# Patient Record
Sex: Male | Born: 1975 | Race: Black or African American | Hispanic: No | Marital: Single | Smoking: Current every day smoker
Health system: Southern US, Community
[De-identification: ages and names within clinical notes are randomized; demographics above are authoritative.]

## PROBLEM LIST (undated history)

## (undated) DIAGNOSIS — F419 Anxiety disorder, unspecified: Secondary | ICD-10-CM

## (undated) DIAGNOSIS — I10 Essential (primary) hypertension: Secondary | ICD-10-CM

## (undated) DIAGNOSIS — J45909 Unspecified asthma, uncomplicated: Secondary | ICD-10-CM

## (undated) HISTORY — DX: Anxiety disorder, unspecified: F41.9

## (undated) HISTORY — DX: Unspecified asthma, uncomplicated: J45.909

## (undated) HISTORY — PX: APPENDECTOMY: SHX54

---

## 2014-07-14 ENCOUNTER — Emergency Department: Payer: Self-pay | Admitting: Emergency Medicine

## 2016-02-26 ENCOUNTER — Encounter: Payer: Self-pay | Admitting: Emergency Medicine

## 2016-02-26 ENCOUNTER — Emergency Department
Admission: EM | Admit: 2016-02-26 | Discharge: 2016-02-26 | Disposition: A | Discharge status: Home / Self Care | Payer: Self-pay | Attending: Emergency Medicine | Admitting: Emergency Medicine

## 2016-02-26 DIAGNOSIS — Y939 Activity, unspecified: Secondary | ICD-10-CM | POA: Insufficient documentation

## 2016-02-26 DIAGNOSIS — Y929 Unspecified place or not applicable: Secondary | ICD-10-CM | POA: Insufficient documentation

## 2016-02-26 DIAGNOSIS — K122 Cellulitis and abscess of mouth: Secondary | ICD-10-CM | POA: Insufficient documentation

## 2016-02-26 DIAGNOSIS — I10 Essential (primary) hypertension: Secondary | ICD-10-CM | POA: Insufficient documentation

## 2016-02-26 DIAGNOSIS — S025XXB Fracture of tooth (traumatic), initial encounter for open fracture: Secondary | ICD-10-CM | POA: Insufficient documentation

## 2016-02-26 DIAGNOSIS — F17210 Nicotine dependence, cigarettes, uncomplicated: Secondary | ICD-10-CM | POA: Insufficient documentation

## 2016-02-26 DIAGNOSIS — X58XXXA Exposure to other specified factors, initial encounter: Secondary | ICD-10-CM | POA: Insufficient documentation

## 2016-02-26 DIAGNOSIS — Y999 Unspecified external cause status: Secondary | ICD-10-CM | POA: Insufficient documentation

## 2016-02-26 HISTORY — DX: Essential (primary) hypertension: I10

## 2016-02-26 MED ORDER — AMOXICILLIN 500 MG PO TABS: 500.0000 mg | ORAL_TABLET | Freq: Three times a day (TID) | ORAL | Status: DC

## 2016-02-26 MED ORDER — IBUPROFEN 600 MG PO TABS: 600.0000 mg | ORAL_TABLET | Freq: Four times a day (QID) | ORAL | Status: DC | PRN

## 2016-02-26 MED ORDER — LIDOCAINE VISCOUS 2 % MT SOLN: 10.0000 mL | OROMUCOSAL | Status: DC | PRN

## 2016-02-26 NOTE — ED Provider Notes (Signed)
[facility] Emergency Department Provider Note  ____________________________________________  Time seen: Approximately 12:02 PM  I have reviewed the triage vital signs and the nursing notes.   HISTORY  Chief Complaint Dental Pain    HPI Drezden Seitzinger. is a 40 y.o. male , NAD, presents to the emergency department with 2 week history of tooth pain and broken teeth. States he's had chronic issues with dentition for some time. Has had pain on and off in his mouth with swelling for 6 months. Notes that 2 teeth have broken and crumbled which is increased his pain over the last couple of days. Did have some swelling about his gumline which has improved with warm salt water gargles but is still present. Has applied for [facility] insurance but has not received coverage at this time. Has consult with local dentists but unfortunately cannot pay those places to get his teeth fixed. He has not looked into any free or low cost clinics and our area. Denies any headache, neck pain, earache. Has not seen any oozing or weeping. No injury or trauma to the face or mouth.   Past Medical History  Diagnosis Date  . Hypertension     There are no active problems to display for this patient.   Past Surgical History  Procedure Laterality Date  . Appendectomy      Current Outpatient Rx  Name  Route  Sig  Dispense  Refill  . amoxicillin (AMOXIL) 500 MG tablet   Oral   Take 1 tablet (500 mg total) by mouth 3 (three) times daily with meals.   30 tablet   0   . ibuprofen (ADVIL,MOTRIN) 600 MG tablet   Oral   Take 1 tablet (600 mg total) by mouth every 6 (six) hours as needed.   30 tablet   0   . lidocaine (XYLOCAINE) 2 % solution   Mouth/Throat   Use as directed 10 mLs in the mouth or throat every 4 (four) hours as needed for mouth pain.   100 mL   0     Allergies Review of patient's allergies indicates no known allergies.  No family history on file.  Social  History Social History  Substance Use Topics  . Smoking status: Current Every Day Smoker -- 1.00 packs/day    Types: Cigarettes  . Smokeless tobacco: None  . Alcohol Use: No     Review of Systems  Constitutional: No fever/chills ENT: Positive dental pain and broken teeth. Cardiovascular: No chest pain. Respiratory: No shortness of breath. No wheezing.  Musculoskeletal: Negative for neck pain.  Skin: Negative for rash, redness, swelling. Neurological: Negative for headaches, focal weakness or numbness. No tingling 10-point ROS otherwise negative.  ____________________________________________   PHYSICAL EXAM:  VITAL SIGNS: ED Triage Vitals  Enc Vitals Group     BP 02/26/16 1146 153/92 mmHg     Pulse Rate 02/26/16 1146 59     Resp 02/26/16 1146 18     Temp 02/26/16 1146 97.8 F (36.6 C)     Temp Source 02/26/16 1146 Oral     SpO2 02/26/16 1146 100 %     Weight 02/26/16 1140 160 lb (72.576 kg)     Height 02/26/16 1140  (1.753 m)     Head Cir --      Peak Flow --      Pain Score 02/26/16 1143 10     Pain Loc --      Pain Edu? --  Excl. in GC? --      Constitutional: Alert and oriented. Well appearing and in no acute distress. Eyes: Conjunctivae are normal.  Head: Atraumatic. ENT:      Nose: No congestion/rhinnorhea.      Mouth/Throat: Mucous membranes are moist. Poor dentition throughout. Broken teeth noted about the left lower jawline and right upper jawline. No active oozing or weeping about the gumline. Mild injection and swelling about the left lower gum line with no tenderness to palpation. Neck: Supple with full range of motion. Hematological/Lymphatic/Immunilogical: No cervical lymphadenopathy. Cardiovascular:  Good peripheral circulation. Respiratory: Normal respiratory effort without tachypnea or retractions.  Neurologic:  Normal speech and language. No gross focal neurologic deficits are appreciated.  Skin:  Skin is warm, dry and intact. No rash,  redness, swelling noted. Psychiatric: Mood and affect are normal. Speech and behavior are normal. Patient exhibits appropriate insight and judgement.   ____________________________________________   LABS  None ____________________________________________  EKG  None ____________________________________________  RADIOLOGY  None ____________________________________________    PROCEDURES  Procedure(s) performed: None    Medications - No data to display   ____________________________________________   INITIAL IMPRESSION / ASSESSMENT AND PLAN / ED COURSE  Patient's diagnosis is consistent with cellulitis oral soft tissues and broken teeth. Patient will be discharged home with prescriptions for amoxicillin, ibuprofen and lidocaine viscus to use as directed. Patient is to follow up with the walk in dental clinic at the [facility] today for evaluation. Patient is given ED precautions to return to the ED for any worsening or new symptoms.    ____________________________________________  FINAL CLINICAL IMPRESSION(S) / ED DIAGNOSES  Final diagnoses:  Cellulitis of oral soft tissues  Broken tooth, open, initial encounter      NEW MEDICATIONS STARTED DURING THIS VISIT:  New Prescriptions   AMOXICILLIN (AMOXIL) 500 MG TABLET    Take 1 tablet (500 mg total) by mouth 3 (three) times daily with meals.   IBUPROFEN (ADVIL,MOTRIN) 600 MG TABLET    Take 1 tablet (600 mg total) by mouth every 6 (six) hours as needed.   LIDOCAINE (XYLOCAINE) 2 % SOLUTION    Use as directed 10 mLs in the mouth or throat every 4 (four) hours as needed for mouth pain.         Hope PigeonJami L Mckinsey Keagle, PA-C 02/26/16 1216  Jennye MoccasinBrian S Quigley, MD 02/26/16 1227

## 2016-02-26 NOTE — ED Notes (Signed)
Dental pain for 2 weeks now, off and on.

## 2016-02-26 NOTE — ED Notes (Signed)
Pt verbalized understanding of discharge instructions. NAD at this time. 

## 2016-02-26 NOTE — ED Notes (Signed)
Pt states 2 broken teeth. One on bottom left and one on bottom right.

## 2016-02-26 NOTE — Discharge Instructions (Signed)
Dental Abscess    A dental abscess is pus in or around a tooth.  HOME CARE  Take medicines only as told by your dentist.  If you were prescribed antibiotic medicine, finish all of it even if you start to feel better.  Rinse your mouth (gargle) often with salt water.  Do not drive or use heavy machinery, like a lawn mower, while taking pain medicine.  Do not apply heat to the outside of your mouth.  Keep all follow-up visits as told by your dentist. This is important. GET HELP IF:  Your pain is worse, and medicine does not help. GET HELP RIGHT AWAY IF:  You have a fever or chills.  Your symptoms suddenly get worse.  You have a very bad headache.  You have problems breathing or swallowing.  You have trouble opening your mouth.  You have puffiness (swelling) in your neck or around your eye. This information is not intended to replace advice given to you by your health care provider. Make sure you discuss any questions you have with your health care provider.  Document Released: 01/13/2015 Document Reviewed: 01/13/2015  Elsevier Interactive Patient Education 2016 [employer] Inc[employer]    Dental Pain    Dental pain may be caused by many things, including:  Tooth decay (cavities or caries). Cavities cause the nerve of your tooth to be open to air and hot or cold temperatures. This can cause pain or discomfort.  Abscess or infection. A dental abscess is an area that is full of infected pus from a bacterial infection in the inner part of the tooth (pulp). It usually happens at the end of the tooth's root.  Injury.  An unknown reason (idiopathic). Your pain may be mild or severe. It may only happen when:  You are chewing.  You are exposed to hot or cold temperature.  You are eating or drinking sugary foods or beverages, such as:  Soda.  Candy. Your pain may also be there all of the time.  HOME CARE  Watch your dental pain for any changes. Do these things to lessen your discomfort:  Take  medicines only as told by your dentist.  If your dentist tells you to take an antibiotic medicine, finish all of it even if you start to feel better.  Keep all follow-up visits as told by your dentist. This is important.  Do not apply heat to the outside of your face.  Rinse your mouth or gargle with salt water if told by your dentist. This helps with pain and swelling.  You can make salt water by adding  tsp of salt to 1 cup of warm water. Apply ice to the painful area of your face:  Put ice in a plastic bag.  Place a towel between your skin and the bag.  Leave the ice on for 20 minutes, 2-3 times per day. Avoid foods or drinks that cause you pain, such as:  Very hot or very cold foods or drinks.  Sweet or sugary foods or drinks. GET HELP IF:  Your pain is not helped with medicines.  Your symptoms are worse.  You have new symptoms. GET HELP RIGHT AWAY IF:  You cannot open your mouth.  You are having trouble breathing or swallowing.  You have a fever.  Your face, neck, or jaw is puffy (swollen). This information is not intended to replace advice given to you by your health care provider. Make sure you discuss any questions you have with your  health care provider.  °Document Released: 02/15/2008 Document Revised: 01/13/2015 Document Reviewed: 08/25/2014  °Elsevier Interactive Patient Education ©2016 Elsevier Inc.  ° °

## 2016-03-01 ENCOUNTER — Encounter: Payer: Self-pay | Admitting: Emergency Medicine

## 2016-03-01 ENCOUNTER — Emergency Department
Admission: EM | Admit: 2016-03-01 | Discharge: 2016-03-01 | Disposition: A | Discharge status: Home / Self Care | Payer: Self-pay | Attending: Emergency Medicine | Admitting: Emergency Medicine

## 2016-03-01 DIAGNOSIS — I10 Essential (primary) hypertension: Secondary | ICD-10-CM | POA: Insufficient documentation

## 2016-03-01 DIAGNOSIS — F17210 Nicotine dependence, cigarettes, uncomplicated: Secondary | ICD-10-CM | POA: Insufficient documentation

## 2016-03-01 DIAGNOSIS — N341 Nonspecific urethritis: Secondary | ICD-10-CM | POA: Insufficient documentation

## 2016-03-01 LAB — URINALYSIS COMPLETE WITH MICROSCOPIC (ARMC ONLY)
Bilirubin Urine: NEGATIVE
Glucose, UA: NEGATIVE mg/dL
HGB URINE DIPSTICK: NEGATIVE
Ketones, ur: NEGATIVE mg/dL
Nitrite: NEGATIVE
PROTEIN: NEGATIVE mg/dL
SPECIFIC GRAVITY, URINE: 1.026 (ref 1.005–1.030)
Squamous Epithelial / LPF: NONE SEEN
pH: 5 (ref 5.0–8.0)

## 2016-03-01 LAB — CHLAMYDIA/NGC RT PCR (ARMC ONLY)
CHLAMYDIA TR: NOT DETECTED
N gonorrhoeae: NOT DETECTED

## 2016-03-01 MED ORDER — CEFTRIAXONE SODIUM 250 MG IJ SOLR
250.0000 mg | INTRAMUSCULAR | Status: DC
  Administered 2016-03-01: 250 mg via INTRAMUSCULAR
  Filled 2016-03-01: qty 250

## 2016-03-01 MED ORDER — AZITHROMYCIN 500 MG PO TABS
1000.0000 mg | ORAL_TABLET | Freq: Once | ORAL | Status: AC
  Administered 2016-03-01: 1000 mg via ORAL
  Filled 2016-03-01: qty 2

## 2016-03-01 NOTE — ED Notes (Signed)
Was seen about 4 days ago with dental abscess.. States he feels like the infection is "spreading" thur out his body  Body aches and "personal " issues

## 2016-03-01 NOTE — ED Provider Notes (Signed)
[facility] Emergency Department Provider Note   ____________________________________________  Time seen: Approximately 7:25 AM  I have reviewed the triage vital signs and the nursing notes.   HISTORY  Chief Complaint Dental Pain   HPI Timothy PeerRobert Leroy Dorer Jr. is a [age] y.o. male is here with complaint of penile discharge for the last 3 days. Patient states that he was here in the emergency room on 6/16 for dental pain and was given amoxicillin which she has been taking. He states that for the last 3 days he has had a white discharge. When questioned more direct patient states that he did have unprotected sex approximately 3 weeks ago and that he only "had oral sex".He gives the impression that the patient did not even know this person very well at all.   Past Medical History  Diagnosis Date  . Hypertension     There are no active problems to display for this patient.   Past Surgical History  Procedure Laterality Date  . Appendectomy      Current Outpatient Rx  Name  Route  Sig  Dispense  Refill  . amoxicillin (AMOXIL) 500 MG tablet   Oral   Take 1 tablet (500 mg total) by mouth 3 (three) times daily with meals.   30 tablet   0   . ibuprofen (ADVIL,MOTRIN) 600 MG tablet   Oral   Take 1 tablet (600 mg total) by mouth every 6 (six) hours as needed.   30 tablet   0   . lidocaine (XYLOCAINE) 2 % solution   Mouth/Throat   Use as directed 10 mLs in the mouth or throat every 4 (four) hours as needed for mouth pain.   100 mL   0     Allergies Review of patient's allergies indicates no known allergies.  No family history on file.  Social History Social History  Substance Use Topics  . Smoking status: Current Every Day Smoker -- 1.00 packs/day    Types: Cigarettes  . Smokeless tobacco: None  . Alcohol Use: No    Review of Systems Constitutional: No fever/chills ENT: No sore throat. Cardiovascular: Denies chest pain. Respiratory:  Denies shortness of breath. Gastrointestinal:  No nausea, no vomiting.   Genitourinary: Positive for dysuria. Musculoskeletal: Negative for back pain. Skin: Negative for rash. Neurological: Negative for headaches, focal weakness or numbness.  10-point ROS otherwise negative.  ____________________________________________   PHYSICAL EXAM:  VITAL SIGNS: ED Triage Vitals  Enc Vitals Group     BP --      Pulse --      Resp --      Temp --      Temp src --      SpO2 --      Weight --      Height --      Head Cir --      Peak Flow --      Pain Score --      Pain Loc --      Pain Edu? --      Excl. in GC? --     Constitutional: Alert and oriented. Well appearing and in no acute distress. Eyes: Conjunctivae are normal. PERRL. EOMI. Head: Atraumatic. Nose: No congestion/rhinnorhea. Neck: No stridor.   Hematological/Lymphatic/Immunilogical: No cervical lymphadenopathy. Cardiovascular: Normal rate, regular rhythm. Grossly normal heart sounds.  Good peripheral circulation. Respiratory: Normal respiratory effort.  No retractions. Lungs CTAB. Gastrointestinal: Soft and nontender. No distention.  Musculoskeletal: No lower extremity  tenderness nor edema.  No joint effusions. Neurologic:  Normal speech and language. No gross focal neurologic deficits are appreciated. No gait instability. Skin:  Skin is warm, dry and intact. No rash noted. Psychiatric: Mood and affect are normal. Speech and behavior are normal.  ____________________________________________   LABS (all labs ordered are listed, but only abnormal results are displayed)  Labs Reviewed  URINALYSIS COMPLETEWITH MICROSCOPIC ([facility] ONLY) - Abnormal; Notable for the following:    Color, Urine YELLOW (*)    APPearance CLEAR (*)    Leukocytes, UA TRACE (*)    Bacteria, UA RARE (*)    All other components within normal limits  CHLAMYDIA/NGC RT PCR ([facility] ONLY)    PROCEDURES  Procedure(s) performed:  None  Critical Care performed: No  ____________________________________________   INITIAL IMPRESSION / ASSESSMENT AND PLAN / ED COURSE  Pertinent labs & imaging results that were available during my care of the patient were reviewed by me and considered in my medical decision making (see chart for details).  Patient was given Rocephin 250 mg IM in the emergency room along with Zithromax 1000 mg by mouth. Patient is to follow-up with the [facility] Department if any continued problems. He is aware that his cultures have not resulted but that his urine didn't show infection. Patient is also to inform his sexual partner and refrain from unprotected sex. ____________________________________________   FINAL CLINICAL IMPRESSION(S) / ED DIAGNOSES  Final diagnoses:  Urethritis, nonspecific      NEW MEDICATIONS STARTED DURING THIS VISIT:  Discharge Medication List as of 03/01/2016  9:46 AM       Note:  This document was prepared using Dragon voice recognition software and may include unintentional dictation errors.    Tommi Rumps, PA-C 03/01/16 1141

## 2016-03-01 NOTE — Discharge Instructions (Signed)
Urethritis, Adult Urethritis is an inflammation of the tube through which urine exits your bladder (urethra).  CAUSES Urethritis is often caused by an infection in your urethra. The infection can be viral, like herpes. The infection can also be bacterial, like gonorrhea. RISK FACTORS Risk factors of urethritis include:  Having sex without using a condom.  Having multiple sexual partners.  Having poor hygiene. SIGNS AND SYMPTOMS Symptoms of urethritis are less noticeable in women than in men. These symptoms include:  Burning feeling when you urinate (dysuria).  Discharge from your urethra.  Blood in your urine (hematuria).  Urinating more than usual. DIAGNOSIS  To confirm a diagnosis of urethritis, your health care provider will do the following:  Ask about your sexual history.  Perform a physical exam.  Have you provide a sample of your urine for lab testing.  Use a cotton swab to gently collect a sample from your urethra for lab testing. TREATMENT  It is important to treat urethritis. Depending on the cause, untreated urethritis may lead to serious genital infections and possibly infertility. Urethritis caused by a bacterial infection is treated with antibiotic medicine. All sexual partners must be treated.  HOME CARE INSTRUCTIONS  Do not have sex until the test results are known and treatment is completed, even if your symptoms go away before you finish treatment.  If you were prescribed an antibiotic, finish it all even if you start to feel better. SEEK MEDICAL CARE IF:   Your symptoms are not improved in 3 days.  Your symptoms are getting worse.  You develop abdominal pain or pelvic pain (in women).  You develop joint pain.  You have a fever. SEEK IMMEDIATE MEDICAL CARE IF:   You have severe pain in the belly, back, or side.  You have repeated vomiting. MAKE SURE YOU:  Understand these instructions.  Will watch your condition.  Will get help right away  if you are not doing well or get worse.   This information is not intended to replace advice given to you by your health care provider. Make sure you discuss any questions you have with your health care provider.   Document Released: 02/22/2001 Document Revised: 01/13/2015 Document Reviewed: 04/29/2013 Elsevier Interactive Patient Education 2016 [employer]    You [facility] if any continued problems. Given information to your partner that they may be checked out as well. Do not have sex until her lower partner has been treated. Do not have unprotected sex. You have been given medication today which should treat your symptoms.

## 2016-05-14 ENCOUNTER — Encounter: Payer: Self-pay | Admitting: Emergency Medicine

## 2016-05-14 ENCOUNTER — Emergency Department
Admission: EM | Admit: 2016-05-14 | Discharge: 2016-05-14 | Disposition: A | Discharge status: Home / Self Care | Payer: Self-pay | Attending: Emergency Medicine | Admitting: Emergency Medicine

## 2016-05-14 DIAGNOSIS — I10 Essential (primary) hypertension: Secondary | ICD-10-CM | POA: Insufficient documentation

## 2016-05-14 DIAGNOSIS — Z79899 Other long term (current) drug therapy: Secondary | ICD-10-CM | POA: Insufficient documentation

## 2016-05-14 DIAGNOSIS — F319 Bipolar disorder, unspecified: Secondary | ICD-10-CM | POA: Insufficient documentation

## 2016-05-14 DIAGNOSIS — F17210 Nicotine dependence, cigarettes, uncomplicated: Secondary | ICD-10-CM | POA: Insufficient documentation

## 2016-05-14 DIAGNOSIS — F419 Anxiety disorder, unspecified: Secondary | ICD-10-CM | POA: Insufficient documentation

## 2016-05-14 LAB — URINE DRUG SCREEN, QUALITATIVE (ARMC ONLY)
Amphetamines, Ur Screen: NOT DETECTED
BARBITURATES, UR SCREEN: NOT DETECTED
BENZODIAZEPINE, UR SCRN: NOT DETECTED
COCAINE METABOLITE, UR ~~LOC~~: NOT DETECTED
Cannabinoid 50 Ng, Ur ~~LOC~~: NOT DETECTED
MDMA (Ecstasy)Ur Screen: NOT DETECTED
METHADONE SCREEN, URINE: NOT DETECTED
OPIATE, UR SCREEN: NOT DETECTED
PHENCYCLIDINE (PCP) UR S: NOT DETECTED
Tricyclic, Ur Screen: NOT DETECTED

## 2016-05-14 LAB — CBC
HCT: 45.7 % (ref 40.0–52.0)
Hemoglobin: 16 g/dL (ref 13.0–18.0)
MCH: 32.2 pg (ref 26.0–34.0)
MCHC: 35 g/dL (ref 32.0–36.0)
MCV: 92 fL (ref 80.0–100.0)
PLATELETS: 177 10*3/uL (ref 150–440)
RBC: 4.97 MIL/uL (ref 4.40–5.90)
RDW: 12.1 % (ref 11.5–14.5)
WBC: 8.4 10*3/uL (ref 3.8–10.6)

## 2016-05-14 LAB — BASIC METABOLIC PANEL
Anion gap: 6 (ref 5–15)
BUN: 13 mg/dL (ref 6–20)
CALCIUM: 9.7 mg/dL (ref 8.9–10.3)
CHLORIDE: 105 mmol/L (ref 101–111)
CO2: 27 mmol/L (ref 22–32)
CREATININE: 1.1 mg/dL (ref 0.61–1.24)
GFR calc non Af Amer: >60 mL/min (ref >60)
Glucose, Bld: 96 mg/dL (ref 65–99)
Potassium: 3.6 mmol/L (ref 3.5–5.1)
SODIUM: 138 mmol/L (ref 135–145)

## 2016-05-14 LAB — ETHANOL: Alcohol, Ethyl (B): <5 mg/dL (ref <5)

## 2016-05-14 LAB — SALICYLATE LEVEL: Salicylate Lvl: <4 mg/dL (ref 2.8–30.0)

## 2016-05-14 LAB — ACETAMINOPHEN LEVEL

## 2016-05-14 LAB — TROPONIN I

## 2016-05-14 MED ORDER — DIAZEPAM 2 MG PO TABS
2.0000 mg | ORAL_TABLET | Freq: Once | ORAL | Status: AC
  Administered 2016-05-14: 2 mg via ORAL
  Filled 2016-05-14: qty 1

## 2016-05-14 MED ORDER — CLONAZEPAM 1 MG PO TABS: 1.0000 mg | ORAL_TABLET | Freq: Two times a day (BID) | ORAL | 0 refills | Status: DC

## 2016-05-14 MED ORDER — CITALOPRAM HYDROBROMIDE 20 MG PO TABS: ORAL_TABLET | ORAL | 2 refills | Status: DC

## 2016-05-14 NOTE — ED Triage Notes (Addendum)
Patient presents to the ED with anxiety and hypertension.  Patient states, "I've been feeling this sometimes for like a month now.  Today I felt normal, and then I took a nap and when I woke up I felt the feeling again, like something wasn't right.  I feel like I could do push ups but my left pinky feels kind of numb."  Patient's smile looks even, grip strength is equal and dorsi and pedal flexion is intact.  Patient is pacing the room during triage.  Patient states, "I think my anxiety is playing a part in this."  Denies dizziness, denies blurry vision, denies headache.  Patient denies chest pain.

## 2016-05-14 NOTE — BH Assessment (Signed)
Tele Assessment Note   Timothy PeerRobert Leroy Sudol Jr. is an 40 y.o. male, African American, Single who presents to [facility] for anxiety/ depression c/o pain. Patient states primary concern is of anxiety elevated and physiological responses to anxiety. Patient states that he currently lives with mother although this is off and on at times. Patient states has hx. Of leisure with music and art, and in the past this helped with anxiety and depression. States took meds in past for it, but meds. Made him feel not self and "too mellow." Patient thoughts racing and at times hard to follow, and was pacing during assessment. Patient did state that he is against taking his own life and that has never been a thought or problem related to the matter. Patent brought self to hospital. Patient acknowledges hx. Of little to no sleep with days where he can go w/o sleep, but norms are for x 2 hours sleep at night then 3-4 during day with naps. Patient acknowledges hx. Of manic episodes and vouts of depression. Patient also acknowledges hx. Of anxiety with worries and racing thoughts.   Patient denies current or past hx. Of SI/HI. Patient denies hx. Of AVH or S.A. Patient denies hx. Of inpatient psychiatric care. Patient acknowledges past outpatient psych care for depression and anxiety at or around x 7-8 years ago. Patient denies current medications,and has no current primary care doctor.   Patient is dressed in normal street attire, and is alert and oriented x4. Patient speech was rapid with though interruptions.  Motor behavior appeared restless with pacing, and hyperactivity.  Patient thought process is coherent yet thought blocking with racing thoughts.. Patient does not appear to be responding to internal stimuli. Patient was cooperative throughout the assessment.   Diagnosis: Bipolar I Disorder, Current or most Recent Episode Hypomanic, Unspecifed  Past Medical History:  Past Medical History:  Diagnosis Date  . Hypertension      Past Surgical History:  Procedure Laterality Date  . APPENDECTOMY      Family History: No family history on file.  Social History:  reports that he has been smoking Cigarettes.  He has been smoking about 1.00 pack per day. He has never used smokeless tobacco. He reports that he does not drink alcohol. His drug history is not on file.  Additional Social History:  Alcohol / Drug Use Pain Medications: SEE MAR Prescriptions: SEE MAR Over the Counter: SEE MAR History of alcohol / drug use?: No history of alcohol / drug abuse Longest period of sobriety (when/how long): n/a  CIWA: CIWA-Ar BP: (!) 150/96 Pulse Rate: 63 COWS:    PATIENT STRENGTHS: (choose at least two) Active sense of humor Average or above average intelligence Capable of independent living Communication skills  Allergies: No Known Allergies  Home Medications:  (Not in a hospital admission)  OB/GYN Status:  No LMP for male patient.  General Assessment Data [facility] Assessment: In system Is this a Tele or Face-to-Face Assessment?: Face-to-Face Is this an Initial Assessment or a Re-assessment for this encounter?: Initial Assessment Marital status: Single Maiden name:  (n/a) Is patient pregnant?: No Pregnancy Status: No Living Arrangements: Parent Can pt return to current living arrangement?: Yes Admission Status: Voluntary Is patient capable of signing voluntary admission?: Yes Referral Source: Self/Family/Friend Insurance type:  (SP)     Crisis Care Plan Living Arrangements: Parent Name of Psychiatrist: none Name of Therapist: none  Education Status Is patient currently in school?: No Current Grade: n/a Highest  grade of school patient has completed: unspecified Name of school: n/a Contact person: mother  Risk to self with the past 6 months Suicidal Ideation: No Has patient been a risk to self within the past 6 months prior to admission? : No Suicidal Intent:  No Has patient had any suicidal intent within the past 6 months prior to admission? : No Is patient at risk for suicide?: Yes (Bipolar I) Suicidal Plan?: No Has patient had any suicidal plan within the past 6 months prior to admission? : No Access to Means: No What has been your use of drugs/alcohol within the last 12 months?: past , some alcohol Previous Attempts/Gestures: No How many times?: 0 Other Self Harm Risks: none noted Triggers for Past Attempts: Unpredictable Intentional Self Injurious Behavior: None Family Suicide History: Yes Recent stressful life event(s): Trauma (Comment), Turmoil (Comment) Persecutory voices/beliefs?: No Depression: No Substance abuse history and/or treatment for substance abuse?: No Suicide prevention information given to non-admitted patients: Not applicable  Risk to Others within the past 6 months Homicidal Ideation: No Does patient have any lifetime risk of violence toward others beyond the six months prior to admission? : No Thoughts of Harm to Others: No Current Homicidal Intent: No Current Homicidal Plan: No Access to Homicidal Means: No Identified Victim: none History of harm to others?: No Assessment of Violence: None Noted Violent Behavior Description: n/a Does patient have access to weapons?: No Criminal Charges Pending?: No Does patient have a court date: No Is patient on probation?: No  Psychosis Hallucinations: None noted Delusions: None noted  Mental Status Report Appearance/Hygiene: Unremarkable Eye Contact: Good Motor Activity: Freedom of movement, Gait exaggerated, Gestures, Hyperactivity Speech: Rapid Level of Consciousness: Alert Mood: Anxious Affect: Anxious Anxiety Level: Panic Attacks Panic attack frequency: unpredictable Most recent panic attack: 05/14/16 Thought Processes: Coherent, Relevant, Flight of Ideas, Thought Blocking Judgement: Partial Orientation: Person, Place, Time, Situation, Appropriate for  developmental age Obsessive Compulsive Thoughts/Behaviors: None  Cognitive Functioning Concentration: Normal Memory: Recent Intact, Remote Intact IQ: Average Insight: Fair Impulse Control: Poor Appetite: Good Weight Loss: 0 Weight Gain: 0 Sleep: Decreased Total Hours of Sleep: 2 Vegetative Symptoms: None  ADLScreening [facility] Assessment Services) Patient's cognitive ability adequate to safely complete daily activities?: Yes Patient able to express need for assistance with ADLs?: Yes Independently performs ADLs?: Yes (appropriate for developmental age)  Prior Inpatient Therapy Prior Inpatient Therapy: No Prior Therapy Dates: n/a Prior Therapy Facilty/Provider(s): n/a Reason for Treatment: n/a  Prior Outpatient Therapy Prior Outpatient Therapy: Yes Prior Therapy Dates: 2001 Prior Therapy Facilty/Provider(s): [employer] Reason for Treatment: Bipolar I Does patient have an ACCT team?: No Does patient have Intensive In-House Services?  : No Does patient have [facility] services? : No Does patient have P4CC services?: No  ADL Screening (condition at time of admission) Patient's cognitive ability adequate to safely complete daily activities?: Yes Is the patient deaf or have difficulty hearing?: No Does the patient have difficulty seeing, even when wearing glasses/contacts?: No Does the patient have difficulty concentrating, remembering, or making decisions?: No Patient able to express need for assistance with ADLs?: Yes Does the patient have difficulty dressing or bathing?: No Independently performs ADLs?: Yes (appropriate for developmental age) Does the patient have difficulty walking or climbing stairs?: No Weakness of Legs: None Weakness of Arms/Hands: None  Home Assistive Devices/Equipment Home Assistive Devices/Equipment: None    Abuse/Neglect Assessment (Assessment to be complete while patient is alone) Physical Abuse: Denies Verbal Abuse: Denies Sexual Abuse:  Denies Exploitation of  patient/patient's resources: Denies Self-Neglect: Denies Values / Beliefs Cultural Requests During Hospitalization: None Spiritual Requests During Hospitalization: None   Advance Directives (For Healthcare) Does patient have an advance directive?: No Would patient like information on creating an advanced directive?: No - patient declined information    Additional Information 1:1 In Past 12 Months?: No CIRT Risk: No Elopement Risk: No Does patient have medical clearance?: No     Disposition:  Disposition Initial Assessment Completed for this Encounter: Yes Disposition of Patient: Other dispositions (TBD)  Hipolito Bayley 05/14/2016 7:19 PM

## 2016-05-14 NOTE — ED Notes (Signed)
Pt resting in bed, says he's feeling much more relaxed after medication; no complaints or requests

## 2016-05-14 NOTE — ED Notes (Signed)
Pt to bathroom and back to room; pt says he's starting to get restless again; [facility] and waiting for consult

## 2016-05-14 NOTE — ED Notes (Signed)
Report to telepsych 

## 2016-05-14 NOTE — ED Provider Notes (Signed)
-----------------------------------------   8:40 PM on 05/14/2016 -----------------------------------------   Blood pressure (!) 145/86, pulse (!) 56, temperature 98.1 F (36.7 C), temperature source Oral, resp. rate 16, height 5\' 9"  (1.753 m), weight 72.6 kg, SpO2 100 %.  Assuming care from Dr. Pershing ProudSchaevitz.  In short, Timothy PeerRobert Leroy Gatson Jr. is a 40 y.o. male with a chief complaint of Anxiety and Hypertension .  Refer to the original H&P for additional details.  The [facility] of care [facility].   ----------------------------------------- 11:05 PM on 05/14/2016 -----------------------------------------  I spoke by phone with the specialist on-call psychiatrist.  He did not feel that the patient needed any sort of inpatient treatment.  He recommended the use of Celexa 20 mg every morning and for the next 5-7 days the use of Klonopin 1 mg twice daily until the Celexa becomes therapeutic.  The patient was sleeping comfortably when I checked on him and I explained the recommendations and the need for outpatient follow-up with [facility].  He understands and agrees with the plan.  I gave my usual and customary return precautions.      Loleta Roseory Jenissa Tyrell, MD 05/14/16 2308

## 2016-05-14 NOTE — ED Notes (Addendum)
Pt presently talking to consulting MD

## 2016-05-14 NOTE — ED Notes (Signed)
Counselor at bedside talking to pt.

## 2016-05-14 NOTE — ED Provider Notes (Signed)
[facility] Emergency Department Provider Note   ____________________________________________   First MD Initiated Contact with Patient 05/14/16 1559     (approximate)  I have reviewed the triage vital signs and the nursing notes.   HISTORY  Chief Complaint Anxiety and Hypertension    HPI Timothy Gomez. is a 40 y.o. male with a history of hypertension and anxiety who is present. Emergency department acutely anxious with hypertension. He says that his anxiety has been worsening over the past month. He says it is multifactorial with stress going from his job as well as making his car payments as well as what he thinks may be increased stress from smoking marijuana. He says his last time smoking marijuana was 3 days ago. Denies any drinking or drug use after that. Denying any pain now but says that his left side feels slightly weaker with tingling which she says is typical of when he gets an increased amount of stress and anxiety. He says he gets "strokelike symptoms." He says that he used to be on antianxiety medication as well as antihypertensive medications but has not been on them for years at this time. Denies any suicidal or homicidal ideation. Says that he has a ride home if he needs antianxiety medications.   Past Medical History:  Diagnosis Date  . Hypertension     There are no active problems to display for this patient.   Past Surgical History:  Procedure Laterality Date  . APPENDECTOMY      Prior to Admission medications   Medication Sig Start Date End Date Taking? Authorizing Provider  amoxicillin (AMOXIL) 500 MG tablet Take 1 tablet (500 mg total) by mouth 3 (three) times daily with meals. Patient not taking: Reported on 05/14/2016 02/26/16   Jami L Hagler, PA-C  ibuprofen (ADVIL,MOTRIN) 600 MG tablet Take 1 tablet (600 mg total) by mouth every 6 (six) hours as needed. Patient not taking: Reported on 05/14/2016 02/26/16   Jami L Hagler,  PA-C  lidocaine (XYLOCAINE) 2 % solution Use as directed 10 mLs in the mouth or throat every 4 (four) hours as needed for mouth pain. Patient not taking: Reported on 05/14/2016 02/26/16   Jami L Hagler, PA-C    Allergies Review of patient's allergies indicates no known allergies.  No family history on file.  Social History Social History  Substance Use Topics  . Smoking status: Current Every Day Smoker    Packs/day: 1.00    Types: Cigarettes  . Smokeless tobacco: Never Used  . Alcohol use No    Review of Systems Constitutional: No fever/chills Eyes: No visual changes. ENT: No sore throat. Cardiovascular: Denies chest pain. Respiratory: Denies shortness of breath. Gastrointestinal: No abdominal pain.  No nausea, no vomiting.  No diarrhea.  No constipation. Genitourinary: Negative for dysuria. Musculoskeletal: Negative for back pain. Skin: Negative for rash. Neurological: Negative for headaches  10-point ROS otherwise negative.  ____________________________________________   PHYSICAL EXAM:  VITAL SIGNS: ED Triage Vitals  Enc Vitals Group     BP 05/14/16 1451 (!) 182/97     Pulse Rate 05/14/16 1451 72     Resp 05/14/16 1451 18     Temp 05/14/16 1451 98.1 F (36.7 C)     Temp Source 05/14/16 1451 Oral     SpO2 05/14/16 1451 100 %     Weight 05/14/16 1453 160 lb (72.6 kg)     Height 05/14/16 1453 5\' 9"  (1.753 m)     Head Circumference --  Peak Flow --      Pain Score --      Pain Loc --      Pain Edu? --      Excl. in GC? --     Constitutional: Alert and oriented. Pacing around the room. Pressured speech. Eyes: Conjunctivae are normal. PERRL. EOMI. Head: Atraumatic. Nose: No congestion/rhinnorhea. Mouth/Throat: Mucous membranes are moist.   Neck: No stridor.   Cardiovascular: Normal rate, regular rhythm. Grossly normal heart sounds.   Respiratory: Normal respiratory effort.  No retractions. Lungs CTAB. Gastrointestinal: Soft and nontender. No distention.   Musculoskeletal: No lower extremity tenderness nor edema.  No joint effusions. Neurologic:  Normal speech and language. No gross focal neurologic deficits are appreciated. No gait instability. Skin:  Skin is warm, dry and intact. No rash noted. Psychiatric: Mood and affect are normal. Speech and behavior are normal.  ____________________________________________   LABS (all labs ordered are listed, but only abnormal results are displayed)  Labs Reviewed  ACETAMINOPHEN LEVEL - Abnormal; Notable for the following:       Result Value   Acetaminophen (Tylenol), Serum <10 (*)    All other components within normal limits  CBC  BASIC METABOLIC PANEL  TROPONIN I  SALICYLATE LEVEL  URINE DRUG SCREEN, QUALITATIVE ([facility] ONLY)  ETHANOL   ____________________________________________  EKG  ED ECG REPORT I, Arelia LongestSchaevitz,  David M, the attending physician, personally viewed and interpreted this ECG.   Date: 05/14/2016  EKG Time: 1727  Rate: 71  Rhythm: normal sinus rhythm  Axis: Normal axis  Intervals:none  ST&T Change: Diffuse ST elevation which is mild with a concave morphology which is consistent with J-point elevation. Also with single T-wave inversion in aVL. Possible LVH. No previous for comparison.   ____________________________________________  RADIOLOGY   ____________________________________________   PROCEDURES  Procedure(s) performed:   Procedures  Critical Care performed:   ____________________________________________   INITIAL IMPRESSION / ASSESSMENT AND PLAN / ED COURSE  Pertinent labs & imaging results that were available during my care of the patient were reviewed by me and considered in my medical decision making (see chart for details).  Patient to have specialist on call psychiatry consult. We'll give Valium for anxiety.  Clinical Course   ----------------------------------------- 8:53 PM on  05/14/2016 -----------------------------------------  Patient pending psychiatry consult at this time. Consult is for medications for anxiety and depression. Since the patient has had Valium he has been more calm and his blood pressure has reduced. Unlikely to need blood pressure meds at this time. It appears that the psychiatric component of his presentation is likely the primary driver of his high blood pressure. Signed out to Dr. York CeriseForbach.  ____________________________________________   FINAL CLINICAL IMPRESSION(S) / ED DIAGNOSES  Anxiety. Hypertension.    NEW MEDICATIONS STARTED DURING THIS VISIT:  New Prescriptions   No medications on file     Note:  This document was prepared using Dragon voice recognition software and may include unintentional dictation errors.    Myrna Blazeravid Matthew Schaevitz, MD 05/14/16 [phone]

## 2016-05-14 NOTE — Discharge Instructions (Signed)
We believe you are suffering from anxiety.  Please fill the provided prescriptions and take them according to the label instructions.    Please return to the ED immediately if you have ANY thoughts of hurting yourself or anyone else, so that we may help you.  Please avoid alcohol and drug use.  Follow up with your doctor and/or therapist as soon as possible regarding today's ED visit.   Please follow up any other recommendations and clinic appointments provided by the psychiatry team that saw you in the Emergency Department.

## 2016-05-15 NOTE — ED Notes (Signed)
Consult complete; questions answered regarding his conversations and recommendations of MD; pt understands Dr York CeriseForbach will be in to follow up and discuss these recommendations; pt on phone with mother; no complaints or requests at this time

## 2016-05-20 ENCOUNTER — Encounter: Payer: Self-pay | Admitting: Emergency Medicine

## 2016-05-20 ENCOUNTER — Emergency Department
Admission: EM | Admit: 2016-05-20 | Discharge: 2016-05-20 | Disposition: A | Discharge status: Home / Self Care | Payer: Self-pay | Attending: Emergency Medicine | Admitting: Emergency Medicine

## 2016-05-20 DIAGNOSIS — K0889 Other specified disorders of teeth and supporting structures: Secondary | ICD-10-CM | POA: Insufficient documentation

## 2016-05-20 DIAGNOSIS — I10 Essential (primary) hypertension: Secondary | ICD-10-CM | POA: Insufficient documentation

## 2016-05-20 DIAGNOSIS — F17210 Nicotine dependence, cigarettes, uncomplicated: Secondary | ICD-10-CM | POA: Insufficient documentation

## 2016-05-20 MED ORDER — AMOXICILLIN 500 MG PO CAPS
500.0000 mg | ORAL_CAPSULE | Freq: Once | ORAL | Status: AC
  Administered 2016-05-20: 500 mg via ORAL
  Filled 2016-05-20: qty 1

## 2016-05-20 MED ORDER — IBUPROFEN 800 MG PO TABS: 800.0000 mg | ORAL_TABLET | Freq: Three times a day (TID) | ORAL | 0 refills | Status: DC | PRN

## 2016-05-20 MED ORDER — IBUPROFEN 800 MG PO TABS
800.0000 mg | ORAL_TABLET | Freq: Once | ORAL | Status: AC
  Administered 2016-05-20: 800 mg via ORAL
  Filled 2016-05-20: qty 1

## 2016-05-20 MED ORDER — AMOXICILLIN 500 MG PO CAPS: 500.0000 mg | ORAL_CAPSULE | Freq: Three times a day (TID) | ORAL | 0 refills | Status: DC

## 2016-05-20 MED ORDER — LIDOCAINE VISCOUS 2 % MT SOLN
15.0000 mL | Freq: Once | OROMUCOSAL | Status: AC
  Administered 2016-05-20: 15 mL via OROMUCOSAL
  Filled 2016-05-20: qty 15

## 2016-05-20 NOTE — ED Provider Notes (Signed)
[facility] Emergency Department Provider Note   ____________________________________________   First MD Initiated Contact with Patient 05/20/16 [phone]     (approximate)  I have reviewed the triage vital signs and the nursing notes.   HISTORY  Chief Complaint Dental Pain    HPI Timothy PeerRobert Leroy Jahnke Jr. is a 40 y.o. male who presents to the ED from home with a chief complaint of toothache. Patient reports left-sided dental pain intermittently for the past several months; seen in June for same. States pain increased today. Denies associated fever, chills, swelling, chest pain, shortness breath, abdominal pain, nausea, vomiting, diarrhea. Denies recent travel, trauma or injury.Has not tried anything for the pain. Nothing makes the pain worse.   Past Medical History:  Diagnosis Date  . Hypertension     There are no active problems to display for this patient.   Past Surgical History:  Procedure Laterality Date  . APPENDECTOMY      Prior to Admission medications   Medication Sig Start Date End Date Taking? Authorizing Provider  amoxicillin (AMOXIL) 500 MG capsule Take 1 capsule (500 mg total) by mouth 3 (three) times daily. 05/20/16   Irean HongJade J Christina Waldrop, MD  citalopram (CELEXA) 20 MG tablet Take 1/2 tablet (10 mg) by mouth in the morning for 7 days.  Then take 1 tablet (20 mg) by mouth in the morning daily. 05/14/16 05/14/17  Loleta Roseory Forbach, MD  clonazePAM (KLONOPIN) 1 MG tablet Take 1 tablet (1 mg total) by mouth 2 (two) times daily. Continue for no more than 7 days. 05/14/16 05/14/17  Loleta Roseory Forbach, MD  ibuprofen (ADVIL,MOTRIN) 800 MG tablet Take 1 tablet (800 mg total) by mouth every 8 (eight) hours as needed for moderate pain. 05/20/16   Irean HongJade J Peighton Mehra, MD  lidocaine (XYLOCAINE) 2 % solution Use as directed 10 mLs in the mouth or throat every 4 (four) hours as needed for mouth pain. Patient not taking: Reported on 05/14/2016 02/26/16   Jami L Hagler, PA-C    Allergies Review of  patient's allergies indicates no known allergies.  No family history on file.  Social History Social History  Substance Use Topics  . Smoking status: Current Every Day Smoker    Packs/day: 1.00    Types: Cigarettes  . Smokeless tobacco: Never Used  . Alcohol use No    Review of Systems  Constitutional: No fever/chills. Eyes: No visual changes. ENT: Positive for dental pain. No sore throat. Cardiovascular: Denies chest pain. Respiratory: Denies shortness of breath. Gastrointestinal: No abdominal pain.  No nausea, no vomiting.  No diarrhea.  No constipation. Genitourinary: Negative for dysuria. Musculoskeletal: Negative for back pain. Skin: Negative for rash. Neurological: Negative for headaches, focal weakness or numbness.  10-point ROS otherwise negative.  ____________________________________________   PHYSICAL EXAM:  VITAL SIGNS: ED Triage Vitals [05/20/16 0038]  Enc Vitals Group     BP (!) 169/100     Pulse Rate 80     Resp 20     Temp 97.7 F (36.5 C)     Temp Source Oral     SpO2 100 %     Weight 160 lb (72.6 kg)     Height 5\' 9"  (1.753 m)     Head Circumference      Peak Flow      Pain Score 10     Pain Loc      Pain Edu?      Excl. in GC?     Constitutional: Soundly asleep,  awakened for exam. Alert and oriented. Well appearing and in no acute distress. Eyes: Conjunctivae are normal. PERRL. EOMI. Head: Atraumatic. Nose: No congestion/rhinnorhea. Mouth/Throat: Left lower molar previously broken. There is no surrounding abscess. Tooth is tender to palpation with tongue blade. Mucous membranes are moist.  Oropharynx non-erythematous. Neck: No stridor.   Cardiovascular: Normal rate, regular rhythm. Grossly normal heart sounds.  Good peripheral circulation. Respiratory: Normal respiratory effort.  No retractions. Lungs CTAB. Gastrointestinal: Soft and nontender. No distention. No abdominal bruits. No CVA tenderness. Musculoskeletal: No lower extremity  tenderness nor edema.  No joint effusions. Neurologic:  Normal speech and language. No gross focal neurologic deficits are appreciated. No gait instability. Skin:  Skin is warm, dry and intact. No rash noted. Psychiatric: Mood and affect are normal. Speech and behavior are normal.  ____________________________________________   LABS (all labs ordered are listed, but only abnormal results are displayed)  Labs Reviewed - No data to display ____________________________________________  EKG  None ____________________________________________  RADIOLOGY  None ____________________________________________   PROCEDURES  Procedure(s) performed: None  Procedures  Critical Care performed: No  ____________________________________________   INITIAL IMPRESSION / ASSESSMENT AND PLAN / ED COURSE  Pertinent labs & imaging results that were available during my care of the patient were reviewed by me and considered in my medical decision making (see chart for details).  40 year old male who presents with dentalgia. Will start antibiotics, NSAIDs and patient given sheet for dental referrals. Strict return precautions given. Patient verbalizes understanding and agrees with plan of care.  Clinical Course     ____________________________________________   FINAL CLINICAL IMPRESSION(S) / ED DIAGNOSES  Final diagnoses:  Pain, dental      NEW MEDICATIONS STARTED DURING THIS VISIT:  Discharge Medication List as of 05/20/2016  5:31 AM    START taking these medications   Details  amoxicillin (AMOXIL) 500 MG capsule Take 1 capsule (500 mg total) by mouth 3 (three) times daily., Starting Fri 05/20/2016, Print         Note:  This document was prepared using Dragon voice recognition software and may include unintentional dictation errors.    Irean Hong, MD 05/20/16 [phone]

## 2016-05-20 NOTE — ED Notes (Signed)
RN had to wake pt up during pain assessment to ask pt to describe type of pain.

## 2016-05-20 NOTE — Discharge Instructions (Signed)
1. Take antibiotic as prescribed (amoxicillin 500 mg 3 times daily 7 days). 2. You may take ibuprofen as needed for pain. 3. Return to the ER for worsening symptoms, persistent vomiting, fevers or other concerns.

## 2016-05-20 NOTE — ED Notes (Signed)
  Reviewed d/c instructions, follow-up care, and prescriptions with pt. Pt verbalized understanding 

## 2016-05-20 NOTE — ED Notes (Signed)
Pt c/o left lower jaw pain beginning yesterday. Pt has a broken left lower molar. Pt reports he took tylenol at 1700 yesterday with no relief. Pt denies taking any pain medication since

## 2016-05-20 NOTE — ED Triage Notes (Signed)
Patient ambulatory to triage with steady gait, without difficulty or distress noted; pt reports left sided dental pain today unrelieved by tylenol

## 2016-05-25 ENCOUNTER — Encounter: Payer: Self-pay | Admitting: Emergency Medicine

## 2016-05-25 ENCOUNTER — Emergency Department
Admission: EM | Admit: 2016-05-25 | Discharge: 2016-05-25 | Disposition: A | Discharge status: Home / Self Care | Payer: Self-pay | Attending: Student in an Organized Health Care Education/Training Program | Admitting: Student in an Organized Health Care Education/Training Program

## 2016-05-25 DIAGNOSIS — K047 Periapical abscess without sinus: Secondary | ICD-10-CM | POA: Insufficient documentation

## 2016-05-25 DIAGNOSIS — F17210 Nicotine dependence, cigarettes, uncomplicated: Secondary | ICD-10-CM | POA: Insufficient documentation

## 2016-05-25 DIAGNOSIS — F419 Anxiety disorder, unspecified: Secondary | ICD-10-CM | POA: Insufficient documentation

## 2016-05-25 DIAGNOSIS — I10 Essential (primary) hypertension: Secondary | ICD-10-CM | POA: Insufficient documentation

## 2016-05-25 MED ORDER — CLINDAMYCIN HCL 150 MG PO CAPS: 150.0000 mg | ORAL_CAPSULE | Freq: Four times a day (QID) | ORAL | 0 refills | Status: AC

## 2016-05-25 MED ORDER — HYDROXYZINE HCL 25 MG PO TABS: 25.0000 mg | ORAL_TABLET | Freq: Three times a day (TID) | ORAL | 0 refills | Status: DC | PRN

## 2016-05-25 MED ORDER — NAPROXEN 500 MG PO TABS: 500.0000 mg | ORAL_TABLET | Freq: Two times a day (BID) | ORAL | 2 refills | Status: AC

## 2016-05-25 NOTE — ED Triage Notes (Addendum)
Left jaw pain  Tooth pain /swelling  Was placed on amoxil last week  Feels like he needs more  Also has run out of klonopin  Unable to see chas drew until next week

## 2016-05-25 NOTE — ED Provider Notes (Signed)
[facility] Emergency Department Provider Note   ____________________________________________   First MD Initiated Contact with Patient 05/25/16 1656     (approximate)  I have reviewed the triage vital signs and the nursing notes.   HISTORY  Chief Complaint Dental Pain   HPI Timothy Gomez. is a 40 y.o. male who presents with left sided jaw pain and dental pain. Was seen here on 05/20/16 and given a prescription for amoxicillin (has 2 doses left) and referred to some dental clinics. Pain is better since 05/20/16. He has an appointment with the dental clinic on Monday, but wants more antibiotics to last until then. Reports he is still having pain and pus along the gums. Patient also requesting refill of klonopin for anxiety, was given 14 pills on 05/15/16 and is now out. He has an appointment to establish care with a PCP at the [facility] clinic on the 20th of this month. He reports that his anxiety is centered around high blood pressure and that the klonopin helps him relax and brings his blood pressure down. He was also given a prescription for citalopram, but has not taken any of that.    Past Medical History:  Diagnosis Date  . Hypertension     There are no active problems to display for this patient.   Past Surgical History:  Procedure Laterality Date  . APPENDECTOMY      Prior to Admission medications   Medication Sig Start Date End Date Taking? Authorizing Provider  citalopram (CELEXA) 20 MG tablet Take 1/2 tablet (10 mg) by mouth in the morning for 7 days.  Then take 1 tablet (20 mg) by mouth in the morning daily. 05/14/16 05/14/17  Loleta Rose, MD  clindamycin (CLEOCIN) 150 MG capsule Take 1 capsule (150 mg total) by mouth 4 (four) times daily. 05/25/16 06/04/16  Chinita Pester, FNP  hydrOXYzine (ATARAX/VISTARIL) 25 MG tablet Take 1 tablet (25 mg total) by mouth 3 (three) times daily as needed. 05/25/16   Chinita Pester, FNP  naproxen (NAPROSYN) 500 MG  tablet Take 1 tablet (500 mg total) by mouth 2 (two) times daily with a meal. 05/25/16 05/25/17  Chinita Pester, FNP    Allergies Review of patient's allergies indicates no known allergies.  No family history on file.  Social History Social History  Substance Use Topics  . Smoking status: Current Every Day Smoker    Packs/day: 1.00    Types: Cigarettes  . Smokeless tobacco: Never Used  . Alcohol use No    Review of Systems Constitutional: No fever/chills ENT: Positive for left jaw and dental pain. No sore throat or nasal congestion.  Cardiovascular: Denies chest pain. Respiratory: Denies shortness of breath. Gastrointestinal: No abdominal pain.  No nausea, no vomiting.  Musculoskeletal: Negative for back pain. Skin: Negative for rash. Neurological: Negative for headaches, focal weakness or numbness. Psychiatric:Anxiety ___________________________________________   PHYSICAL EXAM:  VITAL SIGNS: ED Triage Vitals  Enc Vitals Group     BP 05/25/16 1651 (!) 142/85     Pulse Rate 05/25/16 1651 92     Resp 05/25/16 1651 20     Temp 05/25/16 1651 98.9 F (37.2 C)     Temp Source 05/25/16 1651 Oral     SpO2 05/25/16 1651 99 %     Weight 05/25/16 1655 165 lb (74.8 kg)     Height 05/25/16 1655 5\' 11"  (1.803 m)     Head Circumference --      Peak  Flow --      Pain Score 05/25/16 1652 9     Pain Loc --      Pain Edu? --      Excl. in GC? --     Constitutional: Alert and oriented. Well appearing and in no acute distress. Eyes: Conjunctivae are normal.  Head: Atraumatic. Nose: No congestion/rhinnorhea. Mouth/Throat: Mucous membranes are moist.  Oropharynx non-erythematous. Area of induration along left jaw, broken molar present on left with excoriation of left buccal tissue. Left jaw is moderately TTP.  Neck: No stridor.   Hematological/Lymphatic/Immunilogical: No cervical lymphadenopathy. Cardiovascular: Normal rate, regular rhythm. Grossly normal heart sounds.  Good  peripheral circulation. Respiratory: Normal respiratory effort.  No retractions. Lungs CTAB. Musculoskeletal: Full ROM in bilateral upper extremities with no pain or difficulty.  Neurologic:  Normal speech and language. No gross focal neurologic deficits are appreciated.  Skin:  Skin is warm, dry and intact. No rash noted. Psychiatric: Mood and affect are normal. Speech and behavior are normal.  ____________________________________________   LABS (all labs ordered are listed, but only abnormal results are displayed)  Labs Reviewed - No data to display ____________________________________________  EKG   ____________________________________________  RADIOLOGY   ____________________________________________   PROCEDURES  Procedure(s) performed: None  Procedures  Critical Care performed: No  ____________________________________________   INITIAL IMPRESSION / ASSESSMENT AND PLAN / ED COURSE  Pertinent labs & imaging results that were available during my care of the patient were reviewed by me and considered in my medical decision making (see chart for details).  Given a prescription for clindamycin given continued infection of dental abscess. Patient will see a dentist on 05/30/16. Patient given prescription for naprosyn to control pain related to dental abscess. Patient given prescription for atarax to help with anxiety. He is being seen to establish PCP on 06/01/2016. No other emergency medicine complaints at this time.   Clinical Course     ____________________________________________   FINAL CLINICAL IMPRESSION(S) / ED DIAGNOSES  Final diagnoses:  Dental abscess  Anxiety      NEW MEDICATIONS STARTED DURING THIS VISIT:  Discharge Medication List as of 05/25/2016  5:16 PM    START taking these medications   Details  clindamycin (CLEOCIN) 150 MG capsule Take 1 capsule (150 mg total) by mouth 4 (four) times daily., Starting Wed 05/25/2016, Until Sat 06/04/2016,  Print    hydrOXYzine (ATARAX/VISTARIL) 25 MG tablet Take 1 tablet (25 mg total) by mouth 3 (three) times daily as needed., Starting Wed 05/25/2016, Print    naproxen (NAPROSYN) 500 MG tablet Take 1 tablet (500 mg total) by mouth 2 (two) times daily with a meal., Starting Wed 05/25/2016, Until Thu 05/25/2017, Print         Note:  This document was prepared using Dragon voice recognition software and may include unintentional dictation errors.   Chinita PesterCari B Geni Skorupski, FNP 05/25/16 1724    Willy EddyPatrick Robinson, MD 05/25/16 [phone]

## 2018-04-16 ENCOUNTER — Encounter: Payer: Self-pay | Admitting: Emergency Medicine

## 2018-04-16 ENCOUNTER — Other Ambulatory Visit: Payer: Self-pay

## 2018-04-16 ENCOUNTER — Emergency Department: Payer: Managed Care, Other (non HMO)

## 2018-04-16 ENCOUNTER — Emergency Department
Admission: EM | Admit: 2018-04-16 | Discharge: 2018-04-16 | Disposition: A | Discharge status: Home / Self Care | Payer: Managed Care, Other (non HMO) | Attending: Emergency Medicine | Admitting: Emergency Medicine

## 2018-04-16 DIAGNOSIS — R4182 Altered mental status, unspecified: Secondary | ICD-10-CM | POA: Diagnosis present

## 2018-04-16 DIAGNOSIS — I10 Essential (primary) hypertension: Secondary | ICD-10-CM | POA: Insufficient documentation

## 2018-04-16 DIAGNOSIS — F329 Major depressive disorder, single episode, unspecified: Secondary | ICD-10-CM | POA: Insufficient documentation

## 2018-04-16 DIAGNOSIS — F17210 Nicotine dependence, cigarettes, uncomplicated: Secondary | ICD-10-CM | POA: Diagnosis not present

## 2018-04-16 DIAGNOSIS — N529 Male erectile dysfunction, unspecified: Secondary | ICD-10-CM

## 2018-04-16 DIAGNOSIS — F419 Anxiety disorder, unspecified: Secondary | ICD-10-CM | POA: Insufficient documentation

## 2018-04-16 LAB — BASIC METABOLIC PANEL
ANION GAP: 7 (ref 5–15)
BUN: 16 mg/dL (ref 6–20)
CHLORIDE: 105 mmol/L (ref 98–111)
CO2: 28 mmol/L (ref 22–32)
Calcium: 9.4 mg/dL (ref 8.9–10.3)
Creatinine, Ser: 1.12 mg/dL (ref 0.61–1.24)
GFR calc non Af Amer: >60 mL/min (ref >60)
GLUCOSE: 104 mg/dL — AB (ref 70–99)
Potassium: 3.7 mmol/L (ref 3.5–5.1)
Sodium: 140 mmol/L (ref 135–145)

## 2018-04-16 LAB — URINALYSIS, ROUTINE W REFLEX MICROSCOPIC
Bilirubin Urine: NEGATIVE
Glucose, UA: NEGATIVE mg/dL
Hgb urine dipstick: NEGATIVE
Ketones, ur: NEGATIVE mg/dL
Leukocytes, UA: NEGATIVE
Nitrite: NEGATIVE
PH: 7 (ref 5.0–8.0)
Protein, ur: NEGATIVE mg/dL
SPECIFIC GRAVITY, URINE: 1.014 (ref 1.005–1.030)

## 2018-04-16 LAB — URINE DRUG SCREEN, QUALITATIVE (ARMC ONLY)
Amphetamines, Ur Screen: NOT DETECTED
Barbiturates, Ur Screen: NOT DETECTED
CANNABINOID 50 NG, UR ~~LOC~~: POSITIVE — AB
COCAINE METABOLITE, UR ~~LOC~~: NOT DETECTED
MDMA (ECSTASY) UR SCREEN: NOT DETECTED
Methadone Scn, Ur: NOT DETECTED
OPIATE, UR SCREEN: NOT DETECTED
Phencyclidine (PCP) Ur S: NOT DETECTED
TRICYCLIC, UR SCREEN: NOT DETECTED

## 2018-04-16 LAB — CBC
HCT: 45.1 % (ref 40.0–52.0)
HEMOGLOBIN: 15.4 g/dL (ref 13.0–18.0)
MCH: 32 pg (ref 26.0–34.0)
MCHC: 34.1 g/dL (ref 32.0–36.0)
MCV: 93.7 fL (ref 80.0–100.0)
Platelets: 193 10*3/uL (ref 150–440)
RBC: 4.81 MIL/uL (ref 4.40–5.90)
RDW: 12.3 % (ref 11.5–14.5)
WBC: 8.9 10*3/uL (ref 3.8–10.6)

## 2018-04-16 LAB — APTT: aPTT: 34 seconds (ref 24–36)

## 2018-04-16 LAB — TROPONIN I: Troponin I: <0.03 ng/mL (ref <0.03)

## 2018-04-16 LAB — PROTIME-INR
INR: 1.04
PROTHROMBIN TIME: 13.5 s (ref 11.4–15.2)

## 2018-04-16 LAB — DIFFERENTIAL
BASOS ABS: 0.1 10*3/uL (ref 0–0.1)
Basophils Relative: 1 %
EOS ABS: 0.2 10*3/uL (ref 0–0.7)
Eosinophils Relative: 2 %
LYMPHS ABS: 4.3 10*3/uL — AB (ref 1.0–3.6)
Lymphocytes Relative: 45 %
Monocytes Absolute: 0.7 10*3/uL (ref 0.2–1.0)
Monocytes Relative: 8 %
NEUTROS PCT: 44 %
Neutro Abs: 4 10*3/uL (ref 1.4–6.5)

## 2018-04-16 LAB — ETHANOL: Alcohol, Ethyl (B): <10 mg/dL (ref <10)

## 2018-04-16 MED ORDER — LORAZEPAM 1 MG PO TABS
1.0000 mg | ORAL_TABLET | Freq: Three times a day (TID) | ORAL | 0 refills | Status: AC | PRN
Start: 2018-04-16 — End: 2018-04-23

## 2018-04-16 MED ORDER — LORAZEPAM 2 MG PO TABS
2.0000 mg | ORAL_TABLET | Freq: Once | ORAL | Status: AC
  Administered 2018-04-16: 2 mg via ORAL
  Filled 2018-04-16: qty 1

## 2018-04-16 MED ORDER — LABETALOL HCL 5 MG/ML IV SOLN
20.0000 mg | Freq: Once | INTRAVENOUS | Status: DC
  Filled 2018-04-16: qty 4

## 2018-04-16 NOTE — ED Notes (Signed)

## 2018-04-16 NOTE — ED Notes (Signed)
MRI tech called, message left on voicemail to notify of need for MRI stat.

## 2018-04-16 NOTE — ED Provider Notes (Signed)
I was called by radiology that the patient's head CT shows an age-indeterminate lesion in the thalamus which could represent an acute lacunar infarct.  Is currently in the waiting room and I discussed with the charge nurse Lea and we'll bring the patient straight back.   Merrily Brittleifenbark, Brandii Lakey, MD 04/16/18 [phone]

## 2018-04-16 NOTE — ED Notes (Signed)
Tele neuro cart to room.

## 2018-04-16 NOTE — ED Notes (Signed)
This rn at bedside. Pt ambulatory around room without difficulty. Pt states he is very anxious and confused about testing that has taken place. Pt without obvious neuro deficit at this time. Explanation provided to pt regarding need for tele neuro consult and iv placement. Pt states "wait, now i'm getting real worked up, can I just see the doctor first, see what's goin on." pt notified ed md will be in to see as soon as possible.

## 2018-04-16 NOTE — ED Provider Notes (Signed)
[facility] Emergency Department Provider Note  ____________________________________________   None    (approximate)  I have reviewed the triage vital signs and the nursing notes.   HISTORY  Chief Complaint Hypertension; Depression; and Altered Mental Status   HPI Timothy Gomez. is a 42 y.o. male who comes to the emergency department requesting help with erectile dysfunction.  He says that this evening he was having sex with his girlfriend and while he was initially able to maintain an erection midway through intercourse he lost his erection and was unable to continue.  He has not seen a primary care physician in some time because his former primary was all the way in [place] and he has had difficulty getting down there.  He has a long-standing history of anxiety and today when he was unable to perform in bed he began to hyperventilate and feel tingling in his arms.  He denies headache.  He is requesting "my hormones to be checked".  His symptoms have been insidious onset or intermittent.  They are worsened by stress and improved when he is calm.  He has been under more stress as he has a 16-month-old at home which is his seventh child.    Past Medical History:  Diagnosis Date  . Hypertension     There are no active problems to display for this patient.   Past Surgical History:  Procedure Laterality Date  . APPENDECTOMY      Prior to Admission medications   Medication Sig Start Date End Date Taking? Authorizing Provider  LORazepam (ATIVAN) 1 MG tablet Take 1 tablet (1 mg total) by mouth 3 (three) times daily as needed for up to 5 doses for anxiety. 04/16/18 04/23/18  Merrily Brittle, MD    Allergies Patient has no known allergies.  History reviewed. No pertinent family history.  Social History Social History   Tobacco Use  . Smoking status: Current Every Day Smoker    Packs/day: 0.50    Types: Cigarettes  . Smokeless tobacco: Never Used    Substance Use Topics  . Alcohol use: Yes    Comment: socially   . Drug use: Yes    Types: Marijuana    Comment: last smoked sunday morning    Review of Systems Constitutional: No fever/chills Eyes: No visual changes. ENT: No sore throat. Cardiovascular: Denies chest pain. Respiratory: Denies shortness of breath. Gastrointestinal: No abdominal pain.  No nausea, no vomiting.  No diarrhea.  No constipation. Genitourinary: Negative for dysuria. Musculoskeletal: Negative for back pain. Skin: Negative for rash. Neurological: Negative for headaches, focal weakness or numbness.   ____________________________________________   PHYSICAL EXAM:  VITAL SIGNS: ED Triage Vitals  Enc Vitals Group     BP 04/16/18 0102 (!) 206/118     Pulse Rate 04/16/18 0102 68     Resp 04/16/18 0102 17     Temp 04/16/18 0102 97.9 F (36.6 C)     Temp Source 04/16/18 0102 Oral     SpO2 04/16/18 0102 100 %     Weight 04/16/18 0105 165 lb (74.8 kg)     Height 04/16/18 0105 5\' 9"  (1.753 m)     Head Circumference --      Peak Flow --      Pain Score 04/16/18 0103 0     Pain Loc --      Pain Edu? --      Excl. in GC? --     Constitutional: Alert and oriented x4  quite anxious appearing nontoxic no diaphoresis speaks in full clear sentences Eyes: PERRL EOMI. midrange and brisk Head: Atraumatic. Nose: No congestion/rhinnorhea. Mouth/Throat: No trismus Neck: No stridor.   Cardiovascular: Normal rate, regular rhythm. Grossly normal heart sounds.  Good peripheral circulation. Respiratory: Normal respiratory effort.  No retractions. Lungs CTAB and moving good air Gastrointestinal: Soft nontender Musculoskeletal: No lower extremity edema   Neurologic:  Normal speech and language. No gross focal neurologic deficits are appreciated. Skin:  Skin is warm, dry and intact. No rash noted. Psychiatric: Anxious appearing   ____________________________________________   DIFFERENTIAL includes but not limited  to  Anxiety reaction, panic attack, metabolic derangement, stroke ____________________________________________   LABS (all labs ordered are listed, but only abnormal results are displayed)  Labs Reviewed  BASIC METABOLIC PANEL - Abnormal; Notable for the following components:      Result Value   Glucose, Bld 104 (*)    All other components within normal limits  URINALYSIS, ROUTINE W REFLEX MICROSCOPIC - Abnormal; Notable for the following components:   Color, Urine YELLOW (*)    APPearance CLEAR (*)    All other components within normal limits  URINE DRUG SCREEN, QUALITATIVE ([facility] ONLY) - Abnormal; Notable for the following components:   Cannabinoid 50 Ng, Ur [place] POSITIVE (*)    Benzodiazepine, Ur Scrn TEST NOT PERFORMED, REAGENT NOT AVAILABLE (*)    All other components within normal limits  DIFFERENTIAL - Abnormal; Notable for the following components:   Lymphs Abs 4.3 (*)    All other components within normal limits  CBC  TROPONIN I  ETHANOL  PROTIME-INR  APTT  COMPREHENSIVE METABOLIC PANEL    Lab work reviewed by me with no acute disease __________________________________________  EKG  ED ECG REPORT I, Merrily Brittle, the attending physician, personally viewed and interpreted this ECG.  Date: 04/16/2018 EKG Time:  Rate: 69 Rhythm: normal sinus rhythm QRS Axis: normal Intervals: normal ST/T Wave abnormalities: normal Narrative Interpretation: no evidence of acute ischemia  ____________________________________________  RADIOLOGY  Head CT reviewed by me concerning for acute thalamic infarct MRI of the brain reviewed by me with no acute disease and shows that the previous concern was an artifact ____________________________________________   PROCEDURES  Procedure(s) performed: no  Procedures  Critical Care performed: no  ____________________________________________   INITIAL IMPRESSION / ASSESSMENT AND PLAN / ED COURSE  Pertinent labs & imaging  results that were available during my care of the patient were reviewed by me and considered in my medical decision making (see chart for details).   As part of my medical decision making, I reviewed the following data within the electronic MEDICAL RECORD NUMBER History obtained from family if available, nursing notes, old chart and ekg, as well as notes from prior ED visits.  The patient arrives with chronic symptoms that seem most related to stress.  He has a nonfocal neuro exam and his NIH is 0.  Unclear why he had a head CT ordered from triage however it was read as possible acute stroke so I contacted telemetry neurology.  They felt the patient was not a TPA candidate and I agreed.  I did order an MRI of his brain and prior to the MRI give him 2 mg of oral lorazepam given his anxiety.  Fortunately his neuroimaging is negative and he feels improved.  I will give him a short course of lorazepam for home and help him establish care with primary care here in [place].  He verbalizes understanding and agreement  with the plan.      ____________________________________________   FINAL CLINICAL IMPRESSION(S) / ED DIAGNOSES  Final diagnoses:  Anxiety  Erectile dysfunction, unspecified erectile dysfunction type      NEW MEDICATIONS STARTED DURING THIS VISIT:  New Prescriptions   LORAZEPAM (ATIVAN) 1 MG TABLET    Take 1 tablet (1 mg total) by mouth 3 (three) times daily as needed for up to 5 doses for anxiety.     Note:  This document was prepared using Dragon voice recognition software and may include unintentional dictation errors.     Merrily Brittleifenbark, Aurel Nguyen, MD 04/16/18 [phone]

## 2018-04-16 NOTE — ED Notes (Signed)
Patient is lying comfortably on stretcher at this time with no signs of distress present. Equal, unlabored rise and fall of chest noted within normal rate. Will continue to monitor.

## 2018-04-16 NOTE — Progress Notes (Signed)
CODE STROKE- PHARMACY COMMUNICATION   Time CODE STROKE called/page received: [id number]  Time response to CODE STROKE was made (in person or via phone):   Time Stroke Kit retrieved from [place] (only if needed):   Name of Provider/Nurse contacted:  Past Medical History:  Diagnosis Date  . Hypertension    Prior to Admission medications   Medication Sig Start Date End Date Taking? Authorizing Provider  citalopram (CELEXA) 20 MG tablet Take 1/2 tablet (10 mg) by mouth in the morning for 7 days.  Then take 1 tablet (20 mg) by mouth in the morning daily. 05/14/16 04/16/18  Hinda Kehr, MD  hydrOXYzine (ATARAX/VISTARIL) 25 MG tablet Take 1 tablet (25 mg total) by mouth 3 (three) times daily as needed. 05/25/16 04/16/18  Victorino Dike, FNP    Tobie Lords ,PharmD Clinical Pharmacist  04/16/2018  2:30 AM

## 2018-04-16 NOTE — ED Notes (Signed)
Continue to await iv access.

## 2018-04-16 NOTE — Discharge Instructions (Signed)
It was a pleasure to take care of you today, and thank you for coming to our emergency department.  If you have any questions or concerns before leaving please ask the nurse to grab me and I'm more than happy to go through your aftercare instructions again.  If you were prescribed any opioid pain medication today such as Norco, Vicodin, Percocet, morphine, hydrocodone, or oxycodone please make sure you do not drive when you are taking this medication as it can alter your ability to drive safely.  If you have any concerns once you are home that you are not improving or are in fact getting worse before you can make it to your follow-up appointment, please do not hesitate to call [phone] and come back for further evaluation.  Merrily BrittleNeil Mercedies Ganesh, MD  Results for orders placed or performed during the hospital encounter of 04/16/18  Basic metabolic panel  Result Value Ref Range   Sodium 140 135 - 145 mmol/L   Potassium 3.7 3.5 - 5.1 mmol/L   Chloride 105 98 - 111 mmol/L   CO2 28 22 - 32 mmol/L   Glucose, Bld 104 (H) 70 - 99 mg/dL   BUN 16 6 - 20 mg/dL   Creatinine, Ser 2.951.12 0.61 - 1.24 mg/dL   Calcium 9.4 8.9 - 62.110.3 mg/dL   GFR calc non Af Amer >60 >60 mL/min   GFR calc Af Amer >60 >60 mL/min   Anion gap 7 5 - 15  CBC  Result Value Ref Range   WBC 8.9 3.8 - 10.6 K/uL   RBC 4.81 4.40 - 5.90 MIL/uL   Hemoglobin 15.4 13.0 - 18.0 g/dL   HCT 30.845.1 65.740.0 - 84.652.0 %   MCV 93.7 80.0 - 100.0 fL   MCH 32.0 26.0 - 34.0 pg   MCHC 34.1 32.0 - 36.0 g/dL   RDW 96.212.3 95.211.5 - 84.114.5 %   Platelets 193 150 - 440 K/uL  Troponin I  Result Value Ref Range   Troponin I <0.03 <0.03 ng/mL  Urinalysis, Routine w reflex microscopic  Result Value Ref Range   Color, Urine YELLOW (A) YELLOW   APPearance CLEAR (A) CLEAR   Specific Gravity, Urine 1.014 1.005 - 1.030   pH 7.0 5.0 - 8.0   Glucose, UA NEGATIVE NEGATIVE mg/dL   Hgb urine dipstick NEGATIVE NEGATIVE   Bilirubin Urine NEGATIVE NEGATIVE   Ketones, ur NEGATIVE  NEGATIVE mg/dL   Protein, ur NEGATIVE NEGATIVE mg/dL   Nitrite NEGATIVE NEGATIVE   Leukocytes, UA NEGATIVE NEGATIVE  Urine Drug Screen, Qualitative ([facility] only)  Result Value Ref Range   Tricyclic, Ur Screen NONE DETECTED NONE DETECTED   Amphetamines, Ur Screen NONE DETECTED NONE DETECTED   MDMA (Ecstasy)Ur Screen NONE DETECTED NONE DETECTED   Cocaine Metabolite,Ur [place] NONE DETECTED NONE DETECTED   Opiate, Ur Screen NONE DETECTED NONE DETECTED   Phencyclidine (PCP) Ur S NONE DETECTED NONE DETECTED   Cannabinoid 50 Ng, Ur [place] POSITIVE (A) NONE DETECTED   Barbiturates, Ur Screen NONE DETECTED NONE DETECTED   Benzodiazepine, Ur Scrn TEST NOT PERFORMED, REAGENT NOT AVAILABLE (A) NONE DETECTED   Methadone Scn, Ur NONE DETECTED NONE DETECTED  Ethanol  Result Value Ref Range   Alcohol, Ethyl (B) <10 <10 mg/dL  Protime-INR  Result Value Ref Range   Prothrombin Time 13.5 11.4 - 15.2 seconds   INR 1.04   APTT  Result Value Ref Range   aPTT 34 24 - 36 seconds  Differential  Result Value Ref Range  Neutrophils Relative % 44 %   Neutro Abs 4.0 1.4 - 6.5 K/uL   Lymphocytes Relative 45 %   Lymphs Abs 4.3 (H) 1.0 - 3.6 K/uL   Monocytes Relative 8 %   Monocytes Absolute 0.7 0.2 - 1.0 K/uL   Eosinophils Relative 2 %   Eosinophils Absolute 0.2 0 - 0.7 K/uL   Basophils Relative 1 %   Basophils Absolute 0.1 0 - 0.1 K/uL   Dg Chest 2 View  Result Date: 04/16/2018 CLINICAL DATA:  42 y/o  M; hypertension and anxiety. EXAM: CHEST - 2 VIEW COMPARISON:  None. FINDINGS: The heart size and mediastinal contours are within normal limits. Both lungs are clear. The visualized skeletal structures are unremarkable. IMPRESSION: No active cardiopulmonary disease. Electronically Signed   By: Mitzi Hansen M.D.   On: 04/16/2018 01:38   Ct Head Wo Contrast  Result Date: 04/16/2018 CLINICAL DATA:  42 y/o M; Altered mental status (AMS), unclear cause. EXAM: CT HEAD WITHOUT CONTRAST TECHNIQUE: Contiguous  axial images were obtained from the base of the skull through the vertex without intravenous contrast. COMPARISON:  None. FINDINGS: Brain: Left thalamus subcentimeter lucency. No large acute vascular territory infarct, hemorrhage, focal mass effect, extra-axial collection, hydrocephalus, or herniation. Vascular: No hyperdense vessel or unexpected calcification. Skull: Normal. Negative for fracture or focal lesion. Sinuses/Orbits: No acute finding. Other: None. IMPRESSION: 1. Left thalamus subcentimeter lucency, possible age indeterminate lacunar infarction. 2. Otherwise unremarkable CT of the head. These results were called by telephone at the time of interpretation on 04/16/2018 at 1:44 am to Dr. Lamont Snowball, who verbally acknowledged these results. Electronically Signed   By: Mitzi Hansen M.D.   On: 04/16/2018 01:43   Mr Brain Wo Contrast (neuro Protocol)  Result Date: 04/16/2018 CLINICAL DATA:  42 y/o M; Focal neuro deficit, < 6 hrs, stroke suspected. EXAM: MRI HEAD WITHOUT CONTRAST TECHNIQUE: Multiplanar, multiecho pulse sequences of the brain and surrounding structures were obtained without intravenous contrast. COMPARISON:  04/16/2018 CT head. FINDINGS: Brain: No acute infarction, hemorrhage, hydrocephalus, extra-axial collection or mass lesion. No signal abnormality corresponding to lucency in left thalamus on prior CT of head suggest acute or early subacute infarction. Vascular: Normal flow voids. Skull and upper cervical spine: Normal marrow signal. Sinuses/Orbits: Negative. Other: None. IMPRESSION: No acute intracranial process identified. Normal signal in the left thalamus, the lucency on prior CT probably represents artifact or volume averaging. Unremarkable MRI of the brain. Electronically Signed   By: Mitzi Hansen M.D.   On: 04/16/2018 04:21

## 2018-04-16 NOTE — ED Notes (Signed)
3 different RN in to attempt iv access without success. md notified of need for ultrasound. Ultrasound machine at bedside. Tele neuro nurse lindsay on screen requesting clarification of why md is ordering code stroke and neuro assessment and at this time she states she does not feel comfortable calling a neurologist for this pt.  Pt states he "feels fine other than my sex drive". Pt states he has not had weakness, difficulty walking, no headache. Pt with long standing history of htn, but does not take medication. md notified of tele neuro RN declination to call neurologist at this time. There is currently no clear last known well time.

## 2018-04-16 NOTE — ED Triage Notes (Addendum)
Pt says he quit taking his blood pressure medication (lisinipril) about 3 weeks ago because he didn't feel like it was working, thought he needed a higher dose; no primary care physician; pt says since stopping his medication he doesn't feel like his normal self-low sex drive, some depression, "I feel like a robot"; pt ambulatory with steady gait; talking in complete coherent sentences

## 2018-04-16 NOTE — ED Notes (Signed)
Tele neuro assessment complete. Pt updated on MRI progress, MRI tech enroute.

## 2018-04-16 NOTE — ED Notes (Signed)
Report to allison, rn

## 2018-04-16 NOTE — ED Notes (Signed)
Labetalol still not given due to no access, continue to await md for access.

## 2018-04-16 NOTE — Consult Note (Signed)
Date: 04/16/2018  [facility] Consult Services  Impression:  CT findings of ?left thalamic stroke (per radiologist) with no symptom or exam findings consistent with this.  Although he has mild decrease sensation on the left side of the face, this would not correlate with a left thalamic stroke.  I reviewed the CT head and am not convinced of this finding.   Not a tpa candidate due to: NIHSS 1 for nondisabling symptoms  Does not meet LVO screening criteria (no aphasia, neglect, gaze deviation, dense hemiparesis, or visual field deficits on exam), therefore advanced imaging is not indicated.   Differential Diagnosis:   1. Cardioembolic stroke  2. Small vessel disease/lacune  3. Thromboembolic, artery-to-artery mechanism  4. Hypercoagulable state-related infarct  5. Transient ischemic attack  6. Thrombotic mechanism, large artery disease   Comments: Door Time: 0051 [facility] contacted: [phone] [facility] at bedside: 244 NIHSS assessment start time (time the consultation begins):248 Last known well time (LKW): 0030  Recommendations:  1. Admit to stroke/telemetry 2. Head of bed flat 3. IV Fluids, NS 4. Aspirin if no contraindications 5. DVT prophylaxis 6. Dysphagia screen 7. Neuro checks 8. In house neurology consult.  9. Further stroke workup per in house neurology/internal medicine  Attempted to discuss with ED attending, but was unable to get in touch with him. Please call [facility] if you have any questions: [phone]  -----------------------------------------------------------------------------------------  CC Abnormal CT  History of Present Illness   Patient is a 42 year old man who presents with low sex drive and anxiety for 2-3 weeks, which prompted and ED visit.  A CT head was ordered, and reported as showing a left thalamic stroke.  He states he has coldness around the left wrist that began at 12:30 am.  Diagnostic: CT head  reviewed: read as left thalamic stroke, although I am not convinced of this finding  Exam: Patient is in no apparent distress. Patient appears as stated age. No obvious acute respiratory or cardiac distress. Patient is well groomed and well-nourished.  1a- LOC: Keenly responsive - 0  1b- LOC questions: Answers both questions correctly - 0  1c- LOC commands- Performs both tasks correctly- 0  2- Gaze: Normal; no gaze paresis or gaze deviation - 0  3- Visual Fields: 0  4- Facial movements: 0  5- Upper limb motor - 0  6- Lower limb motor - 0  7- Limb Coordination: absent ataxia - 0  8- Sensory: 1 (decreased left face) 9- Language - 0  10- Speech - 0  11- Neglect - none found - 0  NIHSS score: 1   Medical Decision Making:  - Extensive number of diagnosis or management options are considered above. - Extensive amount of complex data reviewed. - High risk of complication and/or morbidity or mortality are associated with differential diagnostic considerations above.  - There may be Uncertain outcome and increased probability of prolonged functional impairment or high probability of severe prolonged functional impairment associated with some of these differential diagnosis.  Medical Data Reviewed:  1.Data reviewed include clinical labs, radiology,Medical Tests; 2.Tests results discussed w/performing or interpreting physician; 3.Obtaining/reviewing old medical records;  4.Obtaining case history from another source;  5.Independent review of image, tracing or specimen.   Patient was informed the Neurology Consult would happen via TeleHealth consult by way of interactive audio and video telecommunications and consented to receiving care in this manner.

## 2018-04-16 NOTE — Progress Notes (Addendum)
Chaplain responded to a code STROKE. Family (including infant son) at the bedside. Chaplain maintained pastoral presence outside the patient's room, then went in to talk with patient and family for a few moments when appropriate while awaiting arrival of the physician. Patient is anxious and very concerned about his health. Upon entering the room, patient said, "Oh no! Is it that serious that I need a chaplain?" Chaplain provided education to the patient on the role of the chaplain as part of the care team that responds as a standard measure for patient care under certain circumstances. Patient and family understood and seemed to appreciate time to themselves. Marland Kitchen.    04/16/18 0149  Clinical Encounter Type  Visited With Patient and family together  Visit Type Code

## 2018-06-11 NOTE — Progress Notes (Deleted)
Cardiology Office Note  Date:  06/11/2018   ID:  Timothy Peer., DOB 1976-06-17, MRN [id number]  PCP:  Patient, No Pcp Per   No chief complaint on file.   HPI:   Hypertension Depression Erectile dysfunction 7 children Numerous trips to the emergency room for dental issues  MRI brain No acute intracranial process identified. Normal signal in the left thalamus, the lucency on prior CT probably represents artifact or volume averaging. Unremarkable MRI of the brain.   PMH:   has a past medical history of Hypertension.  PSH:    Past Surgical History:  Procedure Laterality Date  . APPENDECTOMY      No current outpatient medications on file.   No current facility-administered medications for this visit.      Allergies:   Patient has no known allergies.   Social History:  The patient  reports that he has been smoking cigarettes. He has been smoking about 0.50 packs per day. He has never used smokeless tobacco. He reports that he drinks alcohol. He reports that he has current or past drug history. Drug: Marijuana.   Family History:   family history is not on file.    Review of Systems: ROS   PHYSICAL EXAM: VS:  There were no vitals taken for this visit. , BMI There is no height or weight on file to calculate BMI. GEN: Well nourished, well developed, in no acute distress HEENT: normal Neck: no JVD, carotid bruits, or masses Cardiac: RRR; no murmurs, rubs, or gallops,no edema  Respiratory:  clear to auscultation bilaterally, normal work of breathing GI: soft, nontender, nondistended, + BS MS: no deformity or atrophy Skin: warm and dry, no rash Neuro:  Strength and sensation are intact Psych: euthymic mood, full affect    Recent Labs: 04/16/2018: BUN 16; Creatinine, Ser 1.12; Hemoglobin 15.4; Platelets 193; Potassium 3.7; Sodium 140    Lipid Panel No results found for: CHOL, HDL, LDLCALC, TRIG    Wt Readings from Last 3 Encounters:  04/16/18 165 lb  (74.8 kg)  05/25/16 165 lb (74.8 kg)  05/20/16 160 lb (72.6 kg)       ASSESSMENT AND PLAN:  No diagnosis found.   Disposition:   F/U  6 months  No orders of the defined types were placed in this encounter.    Signed, Dossie Arbour, M.D., Ph.D. 06/11/2018  [facility] [place], [place] [phone]

## 2018-06-12 ENCOUNTER — Encounter: Payer: Self-pay | Admitting: *Deleted

## 2018-06-12 ENCOUNTER — Ambulatory Visit: Payer: Managed Care, Other (non HMO) | Admitting: Cardiovascular Disease

## 2018-06-13 ENCOUNTER — Encounter: Payer: Self-pay | Admitting: Cardiovascular Disease

## 2018-07-30 NOTE — Progress Notes (Unsigned)
NO SHOW

## 2018-12-24 ENCOUNTER — Encounter: Payer: Self-pay | Admitting: Emergency Medicine

## 2018-12-24 ENCOUNTER — Emergency Department
Admission: EM | Admit: 2018-12-24 | Discharge: 2018-12-24 | Disposition: A | Discharge status: Home / Self Care | Payer: Managed Care, Other (non HMO) | Attending: Emergency Medicine | Admitting: Emergency Medicine

## 2018-12-24 ENCOUNTER — Other Ambulatory Visit: Payer: Self-pay

## 2018-12-24 DIAGNOSIS — J45909 Unspecified asthma, uncomplicated: Secondary | ICD-10-CM | POA: Insufficient documentation

## 2018-12-24 DIAGNOSIS — I10 Essential (primary) hypertension: Secondary | ICD-10-CM | POA: Insufficient documentation

## 2018-12-24 DIAGNOSIS — Z760 Encounter for issue of repeat prescription: Secondary | ICD-10-CM | POA: Insufficient documentation

## 2018-12-24 DIAGNOSIS — F17210 Nicotine dependence, cigarettes, uncomplicated: Secondary | ICD-10-CM | POA: Insufficient documentation

## 2018-12-24 MED ORDER — LISINOPRIL 20 MG PO TABS: 20.0000 mg | ORAL_TABLET | Freq: Every day | ORAL | 1 refills | Status: DC

## 2018-12-24 NOTE — ED Triage Notes (Signed)
Out of bp meds and does not have a pcp right now due to moving.  Lisinopril 20 mg daily is what he was on.

## 2018-12-24 NOTE — ED Notes (Signed)
See triage note  Presents requesting his b/p meds  States he takes lisinopril and has been out  Denies any sx's d/t htn

## 2018-12-24 NOTE — ED Provider Notes (Signed)
[facility] Emergency Department Provider Note  ____________________________________________  Time seen: Approximately 11:46 AM  I have reviewed the triage vital signs and the nursing notes.   HISTORY  Chief Complaint Medication Refill    HPI Timothy Gomez. is a 43 y.o. male presents emergency department requesting medication refill.  Patient just moved to [facility] from [place] and has not yet been able to see a provider in [place].  He takes lisinopril 20 mg once daily.  He has been on medication for about a year.  He denies any concerns with medication.  No shortness of breath, chest pain.   Past Medical History:  Diagnosis Date  . Anxiety   . Asthma   . Hypertension     There are no active problems to display for this patient.   Past Surgical History:  Procedure Laterality Date  . APPENDECTOMY      Prior to Admission medications   Medication Sig Start Date End Date Taking? Authorizing Provider  lisinopril (PRINIVIL,ZESTRIL) 20 MG tablet Take 1 tablet (20 mg total) by mouth daily. 12/24/18 12/24/19  Enid Derry, PA-C    Allergies Patient has no known allergies.  No family history on file.  Social History Social History   Tobacco Use  . Smoking status: Current Every Day Smoker    Packs/day: 0.50    Types: Cigarettes  . Smokeless tobacco: Never Used  Substance Use Topics  . Alcohol use: Yes    Comment: socially   . Drug use: Yes    Types: Marijuana    Comment: last smoked sunday morning     Review of Systems  Cardiovascular: No chest pain. Respiratory: No cough. No SOB. Gastrointestinal: No nausea, no vomiting.  Musculoskeletal: Negative for musculoskeletal pain. Skin: Negative for rash, abrasions, lacerations, ecchymosis. Neurological: Negative for headaches   ____________________________________________   PHYSICAL EXAM:  VITAL SIGNS: ED Triage Vitals  Enc Vitals Group     BP 12/24/18 1117 (!) 147/89   Pulse Rate 12/24/18 1117 70     Resp 12/24/18 1117 14     Temp 12/24/18 1117 97.9 F (36.6 C)     Temp Source 12/24/18 1117 Oral     SpO2 12/24/18 1117 100 %     Weight 12/24/18 1118 60 lb (27.2 kg)     Height 12/24/18 1118 5\' 9"  (1.753 m)     Head Circumference --      Peak Flow --      Pain Score 12/24/18 1118 0     Pain Loc --      Pain Edu? --      Excl. in GC? --      Constitutional: Alert and oriented. Well appearing and in no acute distress. Eyes: Conjunctivae are normal. PERRL. EOMI. Head: Atraumatic. ENT:      Ears:      Nose: No congestion/rhinnorhea.      Mouth/Throat: Mucous membranes are moist.  Neck: No stridor.  Cardiovascular: Normal rate, regular rhythm.  Good peripheral circulation. Respiratory: Normal respiratory effort without tachypnea or retractions. Lungs CTAB. Good air entry to the bases with no decreased or absent breath sounds. Musculoskeletal: Full range of motion to all extremities. No gross deformities appreciated. Neurologic:  Normal speech and language. No gross focal neurologic deficits are appreciated.  Skin:  Skin is warm, dry and intact. No rash noted. Psychiatric: Mood and affect are normal. Speech and behavior are normal. Patient exhibits appropriate insight and judgement.   ____________________________________________   LABS (  all labs ordered are listed, but only abnormal results are displayed)  Labs Reviewed - No data to display ____________________________________________  EKG   ____________________________________________  RADIOLOGY  No results found.  ____________________________________________    PROCEDURES  Procedure(s) performed:    Procedures    Medications - No data to display   ____________________________________________   INITIAL IMPRESSION / ASSESSMENT AND PLAN / ED COURSE  Pertinent labs & imaging results that were available during my care of the patient were reviewed by me and considered in my  medical decision making (see chart for details).  Review of the  [place]RS was performed in accordance of the [facility] prior to dispensing any controlled drugs.     Patient presented to emergency department for medication refill. Patient will be discharged home with prescriptions for lisinopril.  He will call his clinic in [place] and discuss setting up an appointment with them for further medication management until he can get established in [place].  Patient is to follow up with primary care as directed. Patient is given ED precautions to return to the ED for any worsening or new symptoms.     ____________________________________________  FINAL CLINICAL IMPRESSION(S) / ED DIAGNOSES  Final diagnoses:  Encounter for medication refill  Asymptomatic hypertension      NEW MEDICATIONS STARTED DURING THIS VISIT:  ED Discharge Orders         Ordered    lisinopril (PRINIVIL,ZESTRIL) 20 MG tablet  Daily     12/24/18 1149              This chart was dictated using voice recognition software/Dragon. Despite best efforts to proofread, errors can occur which can change the meaning. Any change was purely unintentional.    Enid DerryWagner, Amaryllis Malmquist, PA-C 12/24/18 1739    Don PerkingVeronese, [place], MD 12/26/18 1550

## 2019-08-05 IMAGING — MR MR HEAD W/O CM
10 series · 47 of 48 positions shown · non-contrast
Comparison: 04/16/2018 CT head.

CLINICAL DATA: 42 y/o M; Focal neuro deficit, < 6 hrs, stroke
suspected.

EXAM:
MRI HEAD WITHOUT CONTRAST
TECHNIQUE: Multiplanar, multiecho pulse sequences of the brain and surrounding
structures were obtained without intravenous contrast.

[Series 3: GRE · sagittal · 5.0mm · 0.45mm/px · 3 of 27 slices shown (1 of 2)]
[im 1/27]
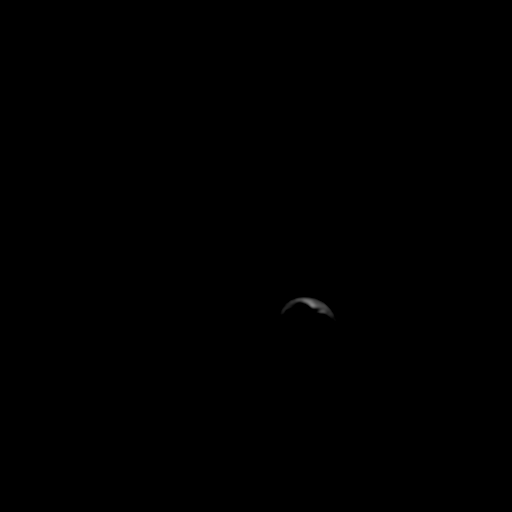
[im 14/27]
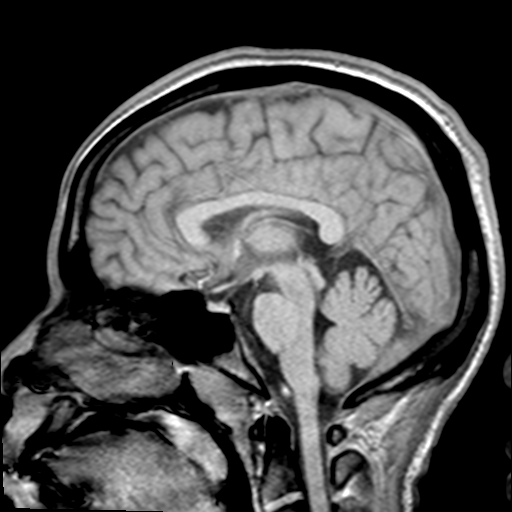
[im 27/27]
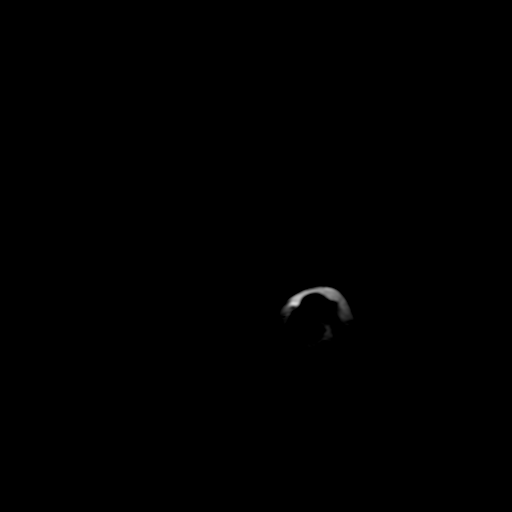

[Series 5: DWI · axial · 3.0mm · 1.80mm/px · z∈[-90,+72]mm · 7 of 54 slices shown (1 of 4)]
[im 1/54]
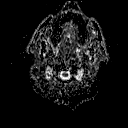
[im 9/54]
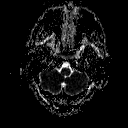
[im 18/54]
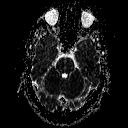
[im 27/54]
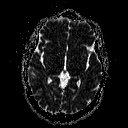
[im 36/54]
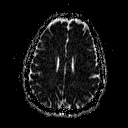
[im 45/54]
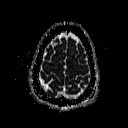
[im 54/54]
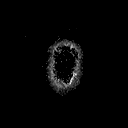

[Series 7: DWI · coronal · 3.0mm · 1.80mm/px · 6 of 49 slices shown (2 of 4)]
[im 1/49]
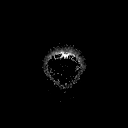
[im 10/49]
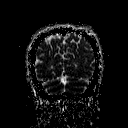
[im 20/49]
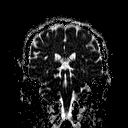
[im 29/49]
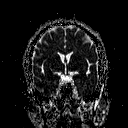
[im 39/49]
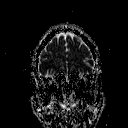
[im 49/49]
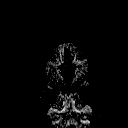

[Series 8: T2 · axial · 5.0mm · 0.45mm/px · z∈[-87,+69]mm · 3 of 25 slices shown (1 of 3)]
[im 1/25]
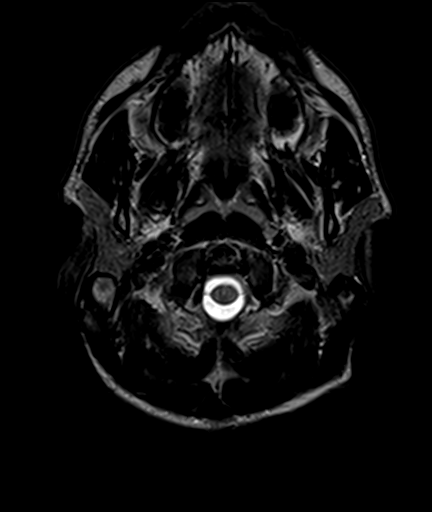
[im 13/25]
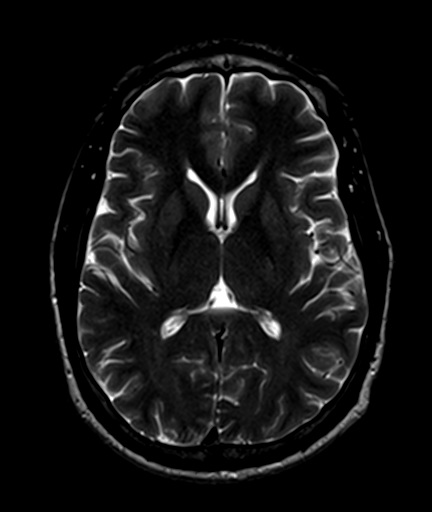
[im 25/25]
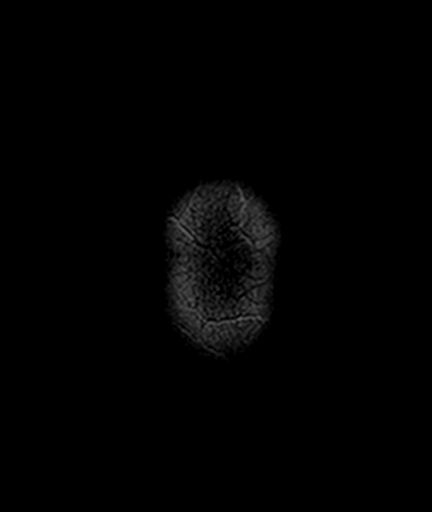

[Series 9: FLAIR · axial · 3.0mm · 0.45mm/px · z∈[-87,+69]mm · 7 of 53 slices shown]
[im 1/53]
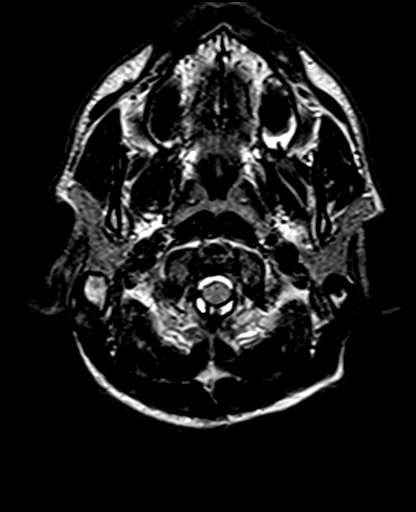
[im 9/53]
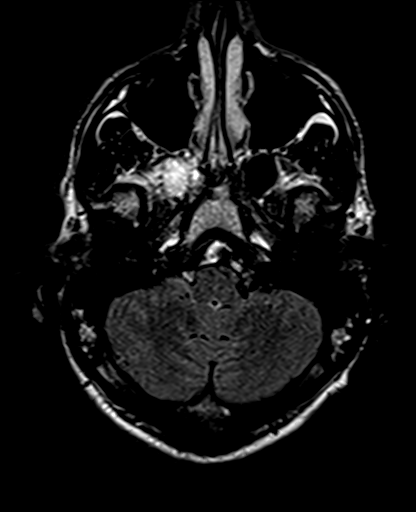
[im 18/53]
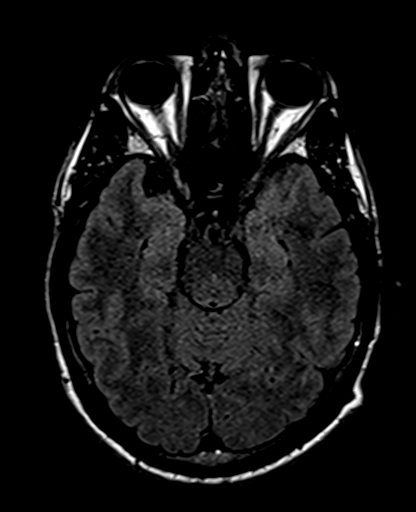
[im 27/53]
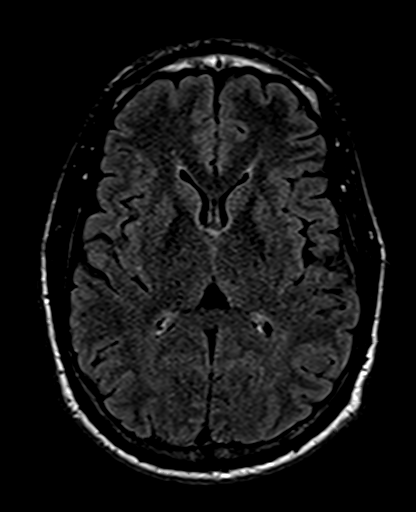
[im 35/53]
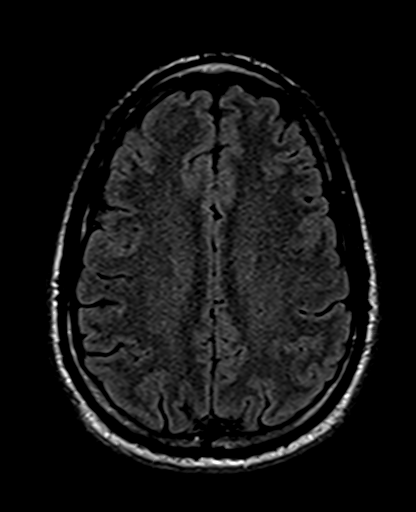
[im 44/53]
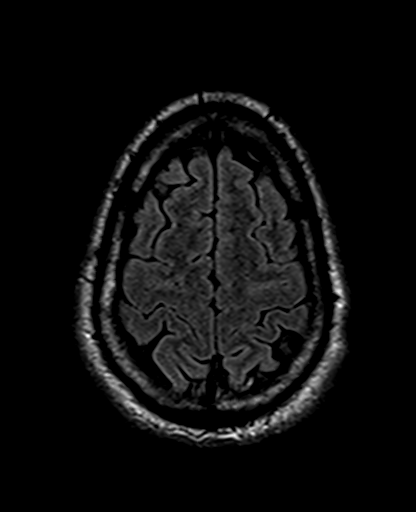
[im 53/53]
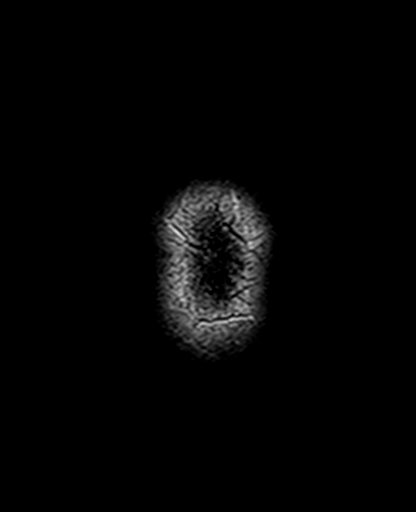

[Series 10: T2 · axial · 5.0mm · 1.20mm/px · z∈[-87,+68]mm · 3 of 25 slices shown (2 of 3)]
[im 1/25]
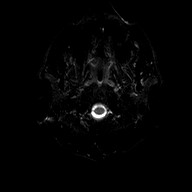
[im 13/25]
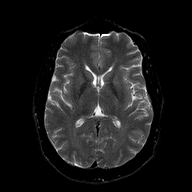
[im 25/25]
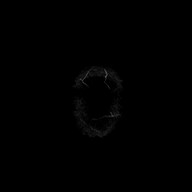

[Series 11: GRE · axial · 5.0mm · 0.45mm/px · z∈[-87,-10]mm · 2 of 25 slices shown (2 of 2)]
[im 1/25]
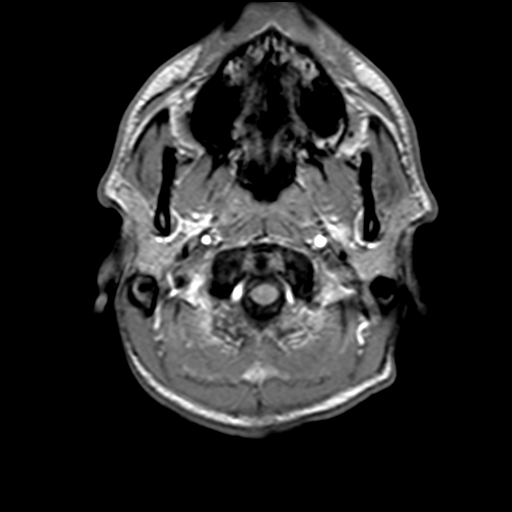
[im 13/25]
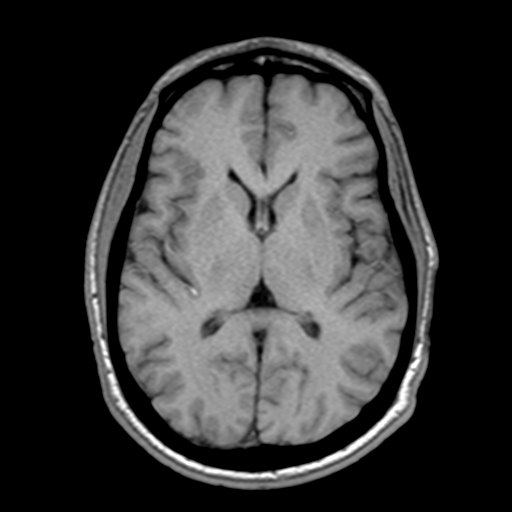

[Series 12: T2 · coronal · 5.0mm · 0.43mm/px · 3 of 28 slices shown (3 of 3)]
[im 1/28]
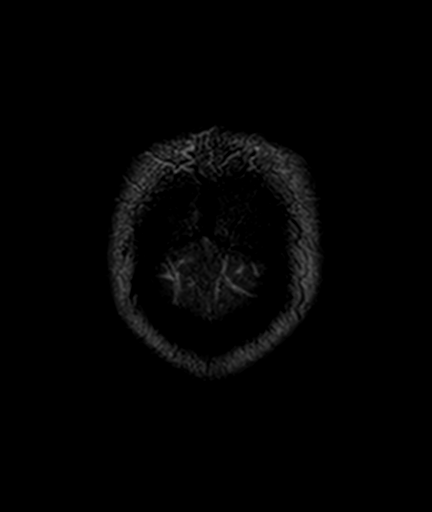
[im 14/28]
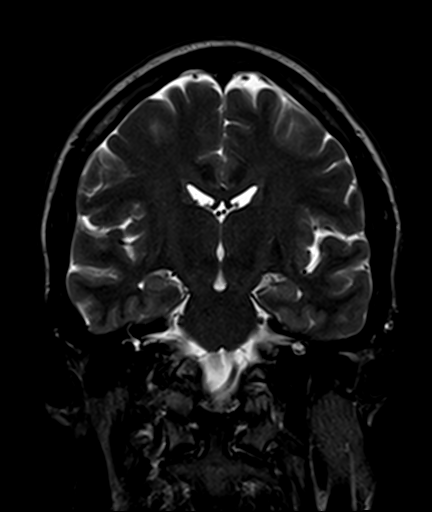
[im 28/28]
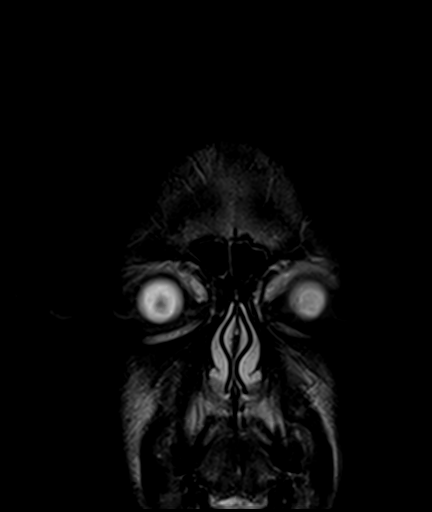

[Series 100: DWI · axial · 3.0mm · 1.80mm/px · z∈[-90,+72]mm · 7 of 55 slices shown (3 of 4)]
[im 1/55]
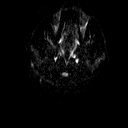
[im 10/55]
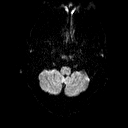
[im 19/55]
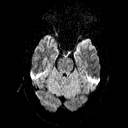
[im 28/55]
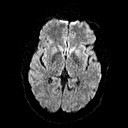
[im 37/55]
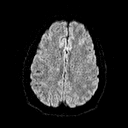
[im 46/55]
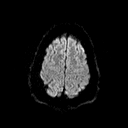
[im 55/55]
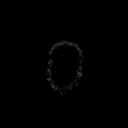

[Series 101: DWI · coronal · 3.0mm · 1.80mm/px · 6 of 49 slices shown (4 of 4)]
[im 1/49]
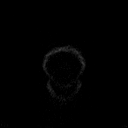
[im 10/49]
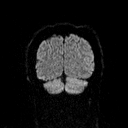
[im 20/49]
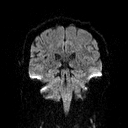
[im 29/49]
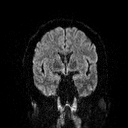
[im 39/49]
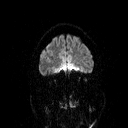
[im 49/49]
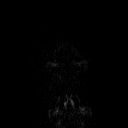

[47 of 48 positions shown; findings below may reference images not displayed]

FINDINGS: Brain: No acute infarction, hemorrhage, hydrocephalus, extra-axial
collection or mass lesion. No signal abnormality corresponding to
lucency in left thalamus on prior CT of head suggest acute or early
subacute infarction.

Vascular: Normal flow voids.

Skull and upper cervical spine: Normal marrow signal.

Sinuses/Orbits: Negative.

Other: None.
IMPRESSION: No acute intracranial process identified. Normal signal in the left
thalamus, the lucency on prior CT probably represents artifact or
volume averaging. Unremarkable MRI of the brain.

By: Quirijn Amazigh M.D.

## 2019-12-17 ENCOUNTER — Telehealth: Payer: Self-pay | Admitting: Gerontology

## 2019-12-17 ENCOUNTER — Ambulatory Visit: Payer: Self-pay | Admitting: Gerontology

## 2019-12-17 NOTE — Telephone Encounter (Signed)
Tried calling number a few times, would not go through. Invalid number or issue with pt phone. Called at 2:42 pm Tuesday 4/6 - NLL

## 2019-12-17 NOTE — Telephone Encounter (Signed)
-----   Message from Rolm Gala, NP sent at 12/17/2019  1:48 PM EDT ----- Timothy Gomez no showed to his appointment, pls call and reschedule him and make a telephone note. Thank you

## 2019-12-19 ENCOUNTER — Telehealth: Payer: Self-pay | Admitting: Gerontology

## 2019-12-19 NOTE — Telephone Encounter (Signed)
Mr. Menken missed his phone appt on 12/17/2019 at 1:30pm.  I tried calling him but his number is unavailable.  Called his emergency contact and left vm on 12/17/2019 at around 9:30am

## 2019-12-23 ENCOUNTER — Telehealth: Payer: Self-pay | Admitting: Gerontology

## 2019-12-23 NOTE — Telephone Encounter (Signed)
Pt was called again about missed appt on 4/6.  Number is unavailable.  Called emergency contact but did not lvm because it was full.  [facility]

## 2019-12-25 ENCOUNTER — Telehealth: Payer: Self-pay | Admitting: Gerontology

## 2020-01-15 NOTE — Telephone Encounter (Signed)
Missing pt ph number

## 2023-04-01 ENCOUNTER — Emergency Department
Admission: EM | Admit: 2023-04-01 | Discharge: 2023-04-01 | Disposition: A | Discharge status: Home / Self Care | Payer: Medicaid Other

## 2023-04-01 NOTE — ED Notes (Signed)
Pt to first nurse desk says he wants to wait to see his doctor.  MSE waiver explained to pt and signed.  Pt LWBS at this time.

## 2023-09-04 ENCOUNTER — Encounter: Payer: Self-pay | Admitting: Medical Oncology

## 2023-09-04 ENCOUNTER — Emergency Department
Admission: EM | Admit: 2023-09-04 | Discharge: 2023-09-04 | Disposition: A | Discharge status: Home / Self Care | Payer: Self-pay | Attending: Emergency Medicine | Admitting: Emergency Medicine

## 2023-09-04 ENCOUNTER — Other Ambulatory Visit: Payer: Self-pay

## 2023-09-04 DIAGNOSIS — I10 Essential (primary) hypertension: Secondary | ICD-10-CM | POA: Insufficient documentation

## 2023-09-04 DIAGNOSIS — R030 Elevated blood-pressure reading, without diagnosis of hypertension: Secondary | ICD-10-CM | POA: Diagnosis present

## 2023-09-04 MED ORDER — LISINOPRIL 20 MG PO TABS: 20.0000 mg | ORAL_TABLET | Freq: Every day | ORAL | 2 refills | Status: DC

## 2023-09-04 MED ORDER — LISINOPRIL 20 MG PO TABS: 20.0000 mg | ORAL_TABLET | Freq: Every day | ORAL | 2 refills | Status: AC

## 2023-09-04 NOTE — ED Notes (Signed)
This nurse went in to discharge pt  He requested to see provider again  States he felt like he was not treated for what he came in for  Pt became loud with provider   Pt was encouraged to leave

## 2023-09-04 NOTE — ED Notes (Signed)
See triage note  Presents with elevated b/p  and some anxiety  States his anxiety as been worse lately

## 2023-09-04 NOTE — ED Provider Notes (Signed)
[facility] Provider Note    Event Date/Time   First MD Initiated Contact with Patient 09/04/23 [phone]     (approximate)   History   Anxiety   HPI  Timothy Gomez. is a 47 y.o. male who presents with complaints of concerns for possible infection and high blood pressure.  Patient reports that a girl that he was with last month told him that she had diagnosed with mycoplasma and he is concerned that he could have this infection.  He has no respiratory complaints or genital complaints.  Also he notes that he stopped taking his blood pressure medication 2 months ago and tried natural remedies but it is not been helping his blood pressure and so he would like to have his medication refilled     Physical Exam   Triage Vital Signs: ED Triage Vitals [09/04/23 0819]  Encounter Vitals Group     BP (!) 160/118     Systolic BP Percentile      Diastolic BP Percentile      Pulse Rate 85     Resp 16     Temp 97.6 F (36.4 C)     Temp Source Oral     SpO2 100 %     Weight 74.8 kg (165 lb)     Height 1.753 m (5\' 9" )     Head Circumference      Peak Flow      Pain Score 0     Pain Loc      Pain Education      Exclude from Growth Chart     Most recent vital signs: Vitals:   09/04/23 0819  BP: (!) 160/118  Pulse: 85  Resp: 16  Temp: 97.6 F (36.4 C)  SpO2: 100%     General: Awake, no distress.  CV:  Good peripheral perfusion.  Resp:  Normal effort.  Abd:  No distention.  Other:     ED Results / Procedures / Treatments   Labs (all labs ordered are listed, but only abnormal results are displayed) Labs Reviewed - No data to display   EKG     RADIOLOGY     PROCEDURES:  Critical Care performed:   Procedures   MEDICATIONS ORDERED IN ED: Medications - No data to display   IMPRESSION / MDM / ASSESSMENT AND PLAN / ED COURSE  I reviewed the triage vital signs and the nursing notes. Patient's presentation is most consistent  with exacerbation of chronic illness.  Discussed with patient that given that he is completely asymptomatic no indication for antibiotics at this time.  Recommended health department for full STD screening if he is concerned about other STDs which she states that he is not.  I looked back in his records, patient used to be on lisinopril 20 mg, will restart this medication  PCP referral placed  Nurse notified me that patient is agitated.  Patient is stating that I am racist because of putting him on lisinopril 20 mg, I discussed with him at length that I confirmed this is what he was on as per our records.  He states "I am trying to kill him by putting him on that dose ".  I explained that 20 mg of lisinopril is what he has been on before and that it is not a dangerous dose given his blood pressure.  He is repeatedly stating that I am racist he wants to be put on antibiotics for reasons that are  unclear  Patient insisted on knowing my name, again calling me racist for putting him on lisinopril 20 mg despite this being the medication he has been on in the past and stormed out of the ED.   I have some concerns about my physical safety and have asked security to keep an eye on the patient as he leaves       FINAL CLINICAL IMPRESSION(S) / ED DIAGNOSES   Final diagnoses:  Uncontrolled hypertension     Rx / DC Orders   ED Discharge Orders          Ordered    Ambulatory Referral to Primary Care (Establish Care)        09/04/23 0859    lisinopril (ZESTRIL) 20 MG tablet  Daily,   Status:  Discontinued        09/04/23 0900    lisinopril (ZESTRIL) 20 MG tablet  Daily        09/04/23 0900             Note:  This document was prepared using Dragon voice recognition software and may include unintentional dictation errors.   Jene Every, MD 09/04/23 [phone]

## 2023-09-04 NOTE — ED Triage Notes (Signed)
Pt reports that he just wants blood work done to see if he has an infection. States that his anxiety has been bad. Pt calm in triage. Denies issues.
